# Patient Record
Sex: Female | Born: 1990 | Race: Black or African American | Hispanic: No | Marital: Single | Smoking: Never smoker
Health system: Southern US, Community
[De-identification: ages and names within clinical notes are randomized; demographics above are authoritative.]

## PROBLEM LIST (undated history)

## (undated) DIAGNOSIS — Z8619 Personal history of other infectious and parasitic diseases: Secondary | ICD-10-CM

## (undated) DIAGNOSIS — I10 Essential (primary) hypertension: Secondary | ICD-10-CM

## (undated) DIAGNOSIS — L732 Hidradenitis suppurativa: Secondary | ICD-10-CM

## (undated) DIAGNOSIS — E669 Obesity, unspecified: Secondary | ICD-10-CM

## (undated) HISTORY — PX: SLEEVE GASTROPLASTY: SHX1101

## (undated) HISTORY — DX: Obesity, unspecified: E66.9

## (undated) HISTORY — DX: Hidradenitis suppurativa: L73.2

---
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
## 2011-02-22 ENCOUNTER — Encounter: Payer: Self-pay | Admitting: Family Medicine

## 2011-02-22 ENCOUNTER — Ambulatory Visit (INDEPENDENT_AMBULATORY_CARE_PROVIDER_SITE_OTHER): Payer: BC Managed Care – PPO | Admitting: Family Medicine

## 2011-02-22 VITALS — BP 120/82 | HR 55 | Temp 98.3°F | Ht 70.0 in | Wt 269.4 lb

## 2011-02-22 DIAGNOSIS — Z0000 Encounter for general adult medical examination without abnormal findings: Secondary | ICD-10-CM | POA: Insufficient documentation

## 2011-02-22 MED ORDER — TUBERCULIN PPD 5 UNIT/0.1ML ID SOLN: 5.0000 [IU] | Freq: Once | INTRADERMAL | Status: DC

## 2011-02-22 NOTE — Progress Notes (Signed)
20 yo here to establish care to fill out college physical form.  Form reviewed, only with [facility] immunization record, she is UTD with exception of PPD.  Pt denies any complaints.  Not sexually active.  Periods are regular and not heavy.  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    The PMH, PSH, Social History, Family History, Medications, and allergies have been reviewed in [facility], and have been updated if relevant.  ROS: Patient reports no  vision/ hearing changes,anorexia, weight change, fever ,adenopathy, persistant / recurrent hoarseness, swallowing issues, chest pain, edema,persistant / recurrent cough, hemoptysis, dyspnea(rest, exertional, paroxysmal nocturnal), gastrointestinal  bleeding (melena, rectal bleeding), abdominal pain, excessive heart burn, GU symptoms(dysuria, hematuria, pyuria, voiding/incontinence  Issues) syncope, focal weakness, severe memory loss, concerning skin lesions, depression, anxiety, abnormal bruising/bleeding, major joint swelling, breast masses or abnormal vaginal bleeding.    Physical exam: BP 120/82  Pulse 55  Temp(Src) 98.3 F (36.8 C) (Oral)  Ht 5\' 10"  (1.778 m)  Wt 269 lb 6.4 oz (122.199 kg)  BMI 38.65 kg/m2  LMP 01/22/2011  General:  Obese,in no acute distress; alert,appropriate and cooperative throughout examination Head:  normocephalic and atraumatic.   Eyes:  vision grossly intact, pupils equal, pupils round, and pupils reactive to light.   Ears:  R ear normal and L ear normal.   Nose:  no external deformity.   Mouth:  good dentition.   Neck:  No deformities, masses, or tenderness noted. Lungs:  Normal respiratory effort, chest expands symmetrically. Lungs are clear to auscultation, no crackles or wheezes. Heart:  Normal rate and regular rhythm. S1 and S2 normal without gallop, murmur, click, rub or other extra sounds. Abdomen:  Bowel sounds positive,abdomen soft and non-tender without masses, organomegaly or hernias  noted. Msk:  No deformity or scoliosis noted of thoracic or lumbar spine.   Extremities:  No clubbing, cyanosis, edema, or deformity noted with normal full range of motion of all joints.   Neurologic:  alert & oriented X3 and gait normal.   Skin:  Intact without suspicious lesions or rashes Cervical Nodes:  No lymphadenopathy noted Axillary Nodes:  No palpable lymphadenopathy Psych:  Cognition and judgment appear intact. Alert and cooperative with normal attention span and concentration. No apparent delusions, illusions, hallucinations

## 2011-02-22 NOTE — Assessment & Plan Note (Signed)
PPD placed. Form filled out. Reviewed preventive care protocols, scheduled due services, and updated immunizations Discussed nutrition, exercise, diet, and healthy lifestyle.

## 2011-02-22 NOTE — Progress Notes (Signed)
Addended by: Gilmer Mor on: 02/22/2011 11:20 AM   Modules accepted: Orders

## 2011-02-24 ENCOUNTER — Ambulatory Visit (INDEPENDENT_AMBULATORY_CARE_PROVIDER_SITE_OTHER): Payer: BC Managed Care – PPO | Admitting: Family Medicine

## 2011-02-24 DIAGNOSIS — Z111 Encounter for screening for respiratory tuberculosis: Secondary | ICD-10-CM

## 2011-02-25 NOTE — Progress Notes (Signed)
  Subjective:    Patient ID: Victoria Gibbs, female    DOB: [birth year], 20 y.o.   MRN: [id number]  HPI  Here for PPD.  Review of Systems     Objective:   Physical Exam        Assessment & Plan:

## 2011-05-18 ENCOUNTER — Ambulatory Visit (INDEPENDENT_AMBULATORY_CARE_PROVIDER_SITE_OTHER): Payer: BC Managed Care – PPO | Admitting: Family Medicine

## 2011-05-18 ENCOUNTER — Encounter: Payer: Self-pay | Admitting: Family Medicine

## 2011-05-18 VITALS — BP 120/64 | HR 88 | Temp 98.5°F | Wt 268.2 lb

## 2011-05-18 DIAGNOSIS — N760 Acute vaginitis: Secondary | ICD-10-CM

## 2011-05-18 MED ORDER — FLUCONAZOLE 150 MG PO TABS: 150.0000 mg | ORAL_TABLET | Freq: Once | ORAL | Status: AC

## 2011-05-18 NOTE — Progress Notes (Signed)
Addended by: Baldomero Lamy on: 05/18/2011 12:38 PM   Modules accepted: Orders

## 2011-05-18 NOTE — Progress Notes (Signed)
SUBJECTIVE:  20 y.o. female complains of scant vaginal discharge for 2 day(s). Denies abnormal vaginal bleeding or significant pelvic pain or fever. No UTI symptoms. Denies history of known exposure to STD but does want to be checked for GC/Chlamydia.  Patient Active Problem List  Diagnoses  . Routine general medical examination at a health care facility   Past Medical History  Diagnosis Date  . Obesity    No past surgical history on file. History  Substance Use Topics  . Smoking status: Never Smoker   . Smokeless tobacco: Not on file  . Alcohol Use: Not on file   Family History  Problem Relation Age of Onset  . Hypertension Mother    No Known Allergies No current outpatient prescriptions on file prior to visit.    Patient's last menstrual period was 03/29/2011.  OBJECTIVE:  BP 120/64  Pulse 88  Temp(Src) 98.5 F (36.9 C) (Oral)  Wt 268 lb 4 oz (121.677 kg)  LMP 03/29/2011  She appears well, afebrile. Abdomen: benign, soft, nontender, no masses. Pelvic Exam: normal external genitalia, vulva, vagina, cervix, uterus and adnexa.   ASSESSMENT:  C. albicans vulvovaginitis  PLAN:  GC and chlamydia DNA  probe sent to lab. Treatment: OTC yeast cream such as Monistat or Gyne-Lotrimin and diflucan 150 mg po x 1. ROV prn if symptoms persist or worsen.

## 2011-12-25 ENCOUNTER — Emergency Department: Payer: Self-pay | Admitting: Internal Medicine

## 2012-02-02 ENCOUNTER — Encounter: Payer: Self-pay | Admitting: Family Medicine

## 2012-02-02 ENCOUNTER — Ambulatory Visit (INDEPENDENT_AMBULATORY_CARE_PROVIDER_SITE_OTHER): Payer: BC Managed Care – PPO | Admitting: Family Medicine

## 2012-02-02 VITALS — BP 110/70 | HR 64 | Temp 98.0°F | Ht 69.25 in | Wt 286.0 lb

## 2012-02-02 DIAGNOSIS — Z0000 Encounter for general adult medical examination without abnormal findings: Secondary | ICD-10-CM

## 2012-02-02 NOTE — Progress Notes (Signed)
21 yo here for CPX and to fill out college physical form.  Form reviewed, only with [facility] immunization record, she is UTD with exception of PPD.  Pt denies any complaints.  Not sexually active.  Periods are regular and not heavy.  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    Patient Active Problem List  Diagnoses  . Routine general medical examination at a health care facility   Past Medical History  Diagnosis Date  . Obesity    No past surgical history on file. History  Substance Use Topics  . Smoking status: Never Smoker   . Smokeless tobacco: Not on file  . Alcohol Use: Not on file   Family History  Problem Relation Age of Onset  . Hypertension Mother    No Known Allergies Current Outpatient Prescriptions on File Prior to Visit  Medication Sig Dispense Refill  . Etonogestrel (IMPLANON) 68 MG IMPL Inject into the skin.           The PMH, PSH, Social History, Family History, Medications, and allergies have been reviewed in [facility], and have been updated if relevant.  ROS: Patient reports no  vision/ hearing changes,anorexia, weight change, fever ,adenopathy, persistant / recurrent hoarseness, swallowing issues, chest pain, edema,persistant / recurrent cough, hemoptysis, dyspnea(rest, exertional, paroxysmal nocturnal), gastrointestinal  bleeding (melena, rectal bleeding), abdominal pain, excessive heart burn, GU symptoms(dysuria, hematuria, pyuria, voiding/incontinence  Issues) syncope, focal weakness, severe memory loss, concerning skin lesions, depression, anxiety, abnormal bruising/bleeding, major joint swelling, breast masses or abnormal vaginal bleeding.    Physical exam: BP 110/70  Pulse 64  Temp(Src) 98 F (36.7 C) (Oral)  Ht 5' 9.25" (1.759 m)  Wt 286 lb (129.729 kg)  BMI 41.93 kg/m2  General:  Obese,in no acute distress; alert,appropriate and cooperative throughout examination Head:  normocephalic and atraumatic.   Eyes:  vision grossly intact,  pupils equal, pupils round, and pupils reactive to light.   Ears:  R ear normal and L ear normal.   Nose:  no external deformity.   Mouth:  good dentition.   Neck:  No deformities, masses, or tenderness noted. Lungs:  Normal respiratory effort, chest expands symmetrically. Lungs are clear to auscultation, no crackles or wheezes. Heart:  Normal rate and regular rhythm. S1 and S2 normal without gallop, murmur, click, rub or other extra sounds. Abdomen:  Bowel sounds positive,abdomen soft and non-tender without masses, organomegaly or hernias noted. Msk:  No deformity or scoliosis noted of thoracic or lumbar spine.   Extremities:  No clubbing, cyanosis, edema, or deformity noted with normal full range of motion of all joints.   Neurologic:  alert & oriented X3 and gait normal.   Skin:  Intact without suspicious lesions or rashes Psych:  Cognition and judgment appear intact. Alert and cooperative with normal attention span and concentration. No apparent delusions, illusions, hallucinations  Assessment and Plan: 1. Routine general medical examination at a health care facility    Reviewed preventive care protocols, scheduled due services, and updated immunizations Discussed nutrition, exercise, diet, and healthy lifestyle. Form filled out and returned to patient.

## 2013-02-27 ENCOUNTER — Ambulatory Visit (INDEPENDENT_AMBULATORY_CARE_PROVIDER_SITE_OTHER): Payer: BC Managed Care – PPO | Admitting: Family Medicine

## 2013-02-27 ENCOUNTER — Encounter: Payer: Self-pay | Admitting: Family Medicine

## 2013-02-27 VITALS — BP 118/80 | HR 64 | Temp 98.2°F | Wt 300.0 lb

## 2013-02-27 DIAGNOSIS — R51 Headache: Secondary | ICD-10-CM | POA: Insufficient documentation

## 2013-02-27 DIAGNOSIS — M25473 Effusion, unspecified ankle: Secondary | ICD-10-CM | POA: Insufficient documentation

## 2013-02-27 DIAGNOSIS — R519 Headache, unspecified: Secondary | ICD-10-CM | POA: Insufficient documentation

## 2013-02-27 LAB — COMPREHENSIVE METABOLIC PANEL
ALT: 14 U/L (ref 0–35)
AST: 16 U/L (ref 0–37)
Albumin: 3.5 g/dL (ref 3.5–5.2)
Calcium: 9.3 mg/dL (ref 8.4–10.5)
Chloride: 107 mEq/L (ref 96–112)
Potassium: 3.8 mEq/L (ref 3.5–5.1)
Total Protein: 8.3 g/dL (ref 6.0–8.3)

## 2013-02-27 LAB — T4, FREE: Free T4: 0.88 ng/dL (ref 0.60–1.60)

## 2013-02-27 LAB — TSH: TSH: 2.17 u[IU]/mL (ref 0.35–5.50)

## 2013-02-27 MED ORDER — HYDROCHLOROTHIAZIDE 25 MG PO TABS: 25.0000 mg | ORAL_TABLET | Freq: Every day | ORAL | Status: DC

## 2013-02-27 NOTE — Progress Notes (Signed)
22 yo female here for urgent care follow up.  Notes reviewed.  Went to [facility] in [place] on 01/25/2013 for bilateral ankle swelling x 1 month.  CMET, CBC wnl. UA and U preg neg. She denies any CP or SOB.  EKG showed sinus bradycardia, otherwise normal.  Started on HCTZ 50 mg daily!  Was not started on Potassium.  Swelling did improve but has felt dizziness when standing from seated position. She is having leg cramping.  Has gained 30 pounds since last year.  In school at [employer]. Working at [employer]- on her feet all day.  Patient Active Problem List   Diagnosis Date Noted  . Morbid obesity 02/27/2013  . Headache(784.0) 02/27/2013  . Ankle swelling 02/27/2013   Past Medical History  Diagnosis Date  . Obesity    No past surgical history on file. History  Substance Use Topics  . Smoking status: Never Smoker   . Smokeless tobacco: Not on file  . Alcohol Use: Not on file   Family History  Problem Relation Age of Onset  . Hypertension Mother    No Known Allergies Current Outpatient Prescriptions on File Prior to Visit  Medication Sig Dispense Refill  . Etonogestrel (IMPLANON) 68 MG IMPL Inject into the skin.         No current facility-administered medications on file prior to visit.     The PMH, PSH, Social History, Family History, Medications, and allergies have been reviewed in [facility], and have been updated if relevant.  ROS: See HPI No CP, No SOB +muscle spasms in legs   + cold/heat intolerance   Physical exam: BP 118/80  Pulse 64  Temp(Src) 98.2 F (36.8 C)  Wt 300 lb (136.079 kg)  BMI 43.98 kg/m2 Wt Readings from Last 3 Encounters:  02/27/13 300 lb (136.079 kg)  02/02/12 286 lb (129.729 kg) (100%*, Z = 2.74)  05/18/11 268 lb 4 oz (121.677 kg) (100%*, Z = 2.59)   * Growth percentiles are based on CDC 2-20 Years data.     General:  Obese,in no acute distress; alert,appropriate and cooperative throughout examination Head:   normocephalic and atraumatic.   Eyes:  vision grossly intact, pupils equal, pupils round, and pupils reactive to light.   Ears:  R ear normal and L ear normal.   Nose:  no external deformity.   Mouth:  good dentition.   Neck:  No deformities, masses, or tenderness noted. Lungs:  Normal respiratory effort, chest expands symmetrically. Lungs are clear to auscultation, no crackles or wheezes. Heart:  Normal rate and regular rhythm. S1 and S2 normal without gallop, murmur, click, rub or other extra sounds. Abdomen:  Bowel sounds positive,abdomen soft and non-tender without masses, organomegaly or hernias noted. Msk:  No deformity or scoliosis noted of thoracic or lumbar spine.   Extremities:  No clubbing, cyanosis, edema, or deformity noted with normal full range of motion of all joints.   Neurologic:  alert & oriented X3 and gait normal.   Skin:  Intact without suspicious lesions or rashes Psych:  Cognition and judgment appear intact. Alert and cooperative with normal attention span and concentration. No apparent delusions, illusions, hallucinations  Assessment and Plan: 1. Morbid obesity Deteriorated. - TSH - T4, Free  2. Ankle swelling, unspecified laterality Improved with HCTZ but very high dose (also having symptoms of orthostasis)- will check electrolytes as her potassium is likely low.  Decrease HCTZ to 25 mg daily. Likely due to increased weight and standing on  feet all day.  - Comprehensive metabolic panel

## 2013-02-27 NOTE — Patient Instructions (Addendum)
We are decreasing your hydrochlorothiazide to 25 mg daily. We will call you with your lab results.  Make sure you keep your feet elevated.

## 2013-05-07 ENCOUNTER — Encounter: Payer: BC Managed Care – PPO | Admitting: Family Medicine

## 2013-07-25 ENCOUNTER — Other Ambulatory Visit: Payer: Self-pay

## 2013-07-29 ENCOUNTER — Telehealth: Payer: Self-pay

## 2013-07-29 NOTE — Telephone Encounter (Signed)
For last 6 months pt has a white vaginal discharge and itching and perineal itching every month; no pattern to when will occur and not related to menstrual cycle.pt has been using monistat 3 day and 5 day OTC; symptoms go away after use but come back the next month. Today pt is having vaginal discharge with vag and perineal itching and burns when urinates. Pt is student at [employer] and will not be back to area until 08/09/13 when pt scheduled appt with Nicki Reaper NP on 08/09/13 at 4 pm. Pt has no fever or abd pain. Pt wants to know what to do until appt. Pt has never seen physician in [place]. Pt request cb. [employer].

## 2013-07-29 NOTE — Telephone Encounter (Signed)
I would advise her to visit student health if she can not wait until she comes home to see me.

## 2013-07-30 NOTE — Telephone Encounter (Signed)
Left v/m for pt to cb. 

## 2013-07-30 NOTE — Telephone Encounter (Signed)
Patient notified as instructed by telephone. Pt voiced understanding.  

## 2013-08-09 ENCOUNTER — Ambulatory Visit (INDEPENDENT_AMBULATORY_CARE_PROVIDER_SITE_OTHER): Payer: BC Managed Care – PPO | Admitting: Internal Medicine

## 2013-08-09 ENCOUNTER — Encounter: Payer: Self-pay | Admitting: Internal Medicine

## 2013-08-09 VITALS — BP 120/80 | HR 95 | Temp 98.3°F | Wt 312.0 lb

## 2013-08-09 DIAGNOSIS — B9689 Other specified bacterial agents as the cause of diseases classified elsewhere: Secondary | ICD-10-CM

## 2013-08-09 DIAGNOSIS — A499 Bacterial infection, unspecified: Secondary | ICD-10-CM

## 2013-08-09 DIAGNOSIS — B373 Candidiasis of vulva and vagina: Secondary | ICD-10-CM

## 2013-08-09 DIAGNOSIS — B3731 Acute candidiasis of vulva and vagina: Secondary | ICD-10-CM

## 2013-08-09 DIAGNOSIS — N760 Acute vaginitis: Secondary | ICD-10-CM

## 2013-08-09 LAB — POCT URINE PREGNANCY: Preg Test, Ur: NEGATIVE

## 2013-08-09 MED ORDER — METRONIDAZOLE 0.75 % VA GEL: 1.0000 | Freq: Two times a day (BID) | VAGINAL | Status: DC

## 2013-08-09 MED ORDER — FLUCONAZOLE 150 MG PO TABS: 150.0000 mg | ORAL_TABLET | Freq: Once | ORAL | Status: DC

## 2013-08-09 NOTE — Patient Instructions (Signed)
Bacterial Vaginosis Bacterial vaginosis (BV) is a vaginal infection where the normal balance of bacteria in the vagina is disrupted. The normal balance is then replaced by an overgrowth of certain bacteria. There are several different kinds of bacteria that can cause BV. BV is the most common vaginal infection in women of childbearing age. CAUSES   The cause of BV is not fully understood. BV develops when there is an increase or imbalance of harmful bacteria.  Some activities or behaviors can upset the normal balance of bacteria in the vagina and put women at increased risk including:  Having a new sex partner or multiple sex partners.  Douching.  Using an intrauterine device (IUD) for contraception.  It is not clear what role sexual activity plays in the development of BV. However, women that have never had sexual intercourse are rarely infected with BV. Women do not get BV from toilet seats, bedding, swimming pools or from touching objects around them.  SYMPTOMS   Grey vaginal discharge.  A fish-like odor with discharge, especially after sexual intercourse.  Itching or burning of the vagina and vulva.  Burning or pain with urination.  Some women have no signs or symptoms at all. DIAGNOSIS  Your caregiver must examine the vagina for signs of BV. Your caregiver will perform lab tests and look at the sample of vaginal fluid through a microscope. They will look for bacteria and abnormal cells (clue cells), a pH test higher than 4.5, and a positive amine test all associated with BV.  RISKS AND COMPLICATIONS   Pelvic inflammatory disease (PID).  Infections following gynecology surgery.  Developing HIV.  Developing herpes virus. TREATMENT  Sometimes BV will clear up without treatment. However, all women with symptoms of BV should be treated to avoid complications, especially if gynecology surgery is planned. Female partners generally do not need to be treated. However, BV may spread  between female sex partners so treatment is helpful in preventing a recurrence of BV.   BV may be treated with antibiotics. The antibiotics come in either pill or vaginal cream forms. Either can be used with nonpregnant or pregnant women, but the recommended dosages differ. These antibiotics are not harmful to the baby.  BV can recur after treatment. If this happens, a second round of antibiotics will often be prescribed.  Treatment is important for pregnant women. If not treated, BV can cause a premature delivery, especially for a pregnant woman who had a premature birth in the past. All pregnant women who have symptoms of BV should be checked and treated.  For chronic reoccurrence of BV, treatment with a type of prescribed gel vaginally twice a week is helpful. HOME CARE INSTRUCTIONS   Finish all medication as directed by your caregiver.  Do not have sex until treatment is completed.  Tell your sexual partner that you have a vaginal infection. They should see their caregiver and be treated if they have problems, such as a mild rash or itching.  Practice safe sex. Use condoms. Only have 1 sex partner. PREVENTION  Basic prevention steps can help reduce the risk of upsetting the natural balance of bacteria in the vagina and developing BV:  Do not have sexual intercourse (be abstinent).  Do not douche.  Use all of the medicine prescribed for treatment of BV, even if the signs and symptoms go away.  Tell your sex partner if you have BV. That way, they can be treated, if needed, to prevent reoccurrence. SEEK MEDICAL CARE IF:     Your symptoms are not improving after 3 days of treatment.  You have increased discharge, pain, or fever. MAKE SURE YOU:   Understand these instructions.  Will watch your condition.  Will get help right away if you are not doing well or get worse. FOR MORE INFORMATION  Division of STD Prevention (DSTDP), Centers for Disease Control and Prevention:  www.cdc.gov/std American Social Health Association (ASHA): www.ashastd.org  Document Released: 09/05/2005 Document Revised: 11/28/2011 Document Reviewed: 04/17/2013 ExitCare Patient Information 2014 [employer].  

## 2013-08-09 NOTE — Progress Notes (Signed)
  Subjective:    Patient ID: Victoria Gibbs, female    DOB: 1991/05/25, 22 y.o.   MRN: [id number]  HPI  Pt presents to the clinic today with c/o vaginal discharge. This started about a week ago. She does have reoccuring yeast infections. She is using Monistat OTC which helps it go away but it comes right back. In between yeast infection, she still has a thin white discharge. Her LMP was beginning of October. She does not think she is pregnant. She is on the implanon. She is not concerned about STD.  Review of Systems      Past Medical History  Diagnosis Date  . Obesity     Current Outpatient Prescriptions  Medication Sig Dispense Refill  . Etonogestrel (IMPLANON) 68 MG IMPL Inject into the skin.        . hydrochlorothiazide (HYDRODIURIL) 25 MG tablet Take 1 tablet (25 mg total) by mouth daily.  90 tablet  3   No current facility-administered medications for this visit.    No Known Allergies  Family History  Problem Relation Age of Onset  . Hypertension Mother     History   Social History  . Marital Status: Single    Spouse Name: N/A    Number of Children: N/A  . Years of Education: N/A   Occupational History  . Not on file.   Social History Main Topics  . Smoking status: Never Smoker   . Smokeless tobacco: Not on file  . Alcohol Use: Not on file  . Drug Use: Not on file  . Sexual Activity: Not on file   Other Topics Concern  . Not on file   Social History Narrative  . No narrative on file     Constitutional: Denies fever, malaise, fatigue, headache or abrupt weight changes.  GU: Denies urgency, frequency, pain with urination, burning sensation, blood in urine, odor.   No other specific complaints in a complete review of systems (except as listed in HPI above).  Objective:   Physical Exam  BP 120/80  Pulse 95  Temp(Src) 98.3 F (36.8 C) (Tympanic)  Wt 312 lb (141.522 kg)  SpO2 99% Wt Readings from Last 3 Encounters:  08/09/13 312 lb (141.522 kg)   02/27/13 300 lb (136.079 kg)  02/02/12 286 lb (129.729 kg) (100%*, Z = 2.74)   * Growth percentiles are based on CDC 2-20 Years data.    Constitutional:  Alert, oriented x 4, well developed, well nourished in no apparent distress. Cardiovascular: Normal rate and rhythm. S1,S2 noted.  No murmur, rubs or gallops noted. No JVD or BLE edema. No carotid bruits noted. Pulmonary/Chest: Normal effort and positive vesicular breath sounds. No respiratory distress. No wheezes, rales or ronchi noted.  Abdomen: Soft and nontender. Normal bowel sounds, no bruits noted. No distention or masses noted. Liver, spleen and kidneys non palpable. Genitourinary: Normal female anatomy. Uterus midline, anterior and soft. No CMT. Thick with discharge noted. Adenexa non palpable.        Assessment & Plan:   Vaginal discharge secondary to yeast infection and BV:  Wet prep obtained: + clue cells, + yeast, no trich eRx for diflucan and metrogel Urine preg negative   RTC as needed or if symptoms persist or worsen

## 2013-10-03 ENCOUNTER — Telehealth: Payer: Self-pay

## 2013-10-03 NOTE — Telephone Encounter (Signed)
Pt has perineal itching, vaginal discharge is almost gone. Pt scheduled appt Nicki Reaperegina Baity NP 10/04/13.

## 2013-10-04 ENCOUNTER — Ambulatory Visit (INDEPENDENT_AMBULATORY_CARE_PROVIDER_SITE_OTHER): Payer: BC Managed Care – PPO | Admitting: Internal Medicine

## 2013-10-04 ENCOUNTER — Encounter: Payer: Self-pay | Admitting: Internal Medicine

## 2013-10-04 VITALS — BP 114/68 | HR 87 | Temp 99.2°F | Wt 316.5 lb

## 2013-10-04 DIAGNOSIS — B373 Candidiasis of vulva and vagina: Secondary | ICD-10-CM

## 2013-10-04 DIAGNOSIS — B9689 Other specified bacterial agents as the cause of diseases classified elsewhere: Secondary | ICD-10-CM

## 2013-10-04 DIAGNOSIS — I10 Essential (primary) hypertension: Secondary | ICD-10-CM

## 2013-10-04 DIAGNOSIS — N760 Acute vaginitis: Secondary | ICD-10-CM

## 2013-10-04 DIAGNOSIS — A499 Bacterial infection, unspecified: Secondary | ICD-10-CM

## 2013-10-04 DIAGNOSIS — B3731 Acute candidiasis of vulva and vagina: Secondary | ICD-10-CM

## 2013-10-04 MED ORDER — HYDROCHLOROTHIAZIDE 25 MG PO TABS: 25.0000 mg | ORAL_TABLET | Freq: Every day | ORAL | Status: DC

## 2013-10-04 MED ORDER — FLUCONAZOLE 150 MG PO TABS: 150.0000 mg | ORAL_TABLET | Freq: Once | ORAL | Status: DC

## 2013-10-04 MED ORDER — METRONIDAZOLE 0.75 % VA GEL: 1.0000 | Freq: Two times a day (BID) | VAGINAL | Status: DC

## 2013-10-04 NOTE — Patient Instructions (Signed)
Candidal Vulvovaginitis  Candidal vulvovaginitis is an infection of the vagina and vulva. The vulva is the skin around the opening of the vagina. This may cause itching and discomfort in and around the vagina.   HOME CARE  · Only take medicine as told by your doctor.  · Do not have sex (intercourse) until the infection is healed or as told by your doctor.  · Practice safe sex.  · Tell your sex partner about your infection.  · Do not douche or use tampons.  · Wear cotton underwear. Do not wear tight pants or panty hose.  · Eat yogurt. This may help treat and prevent yeast infections.  GET HELP RIGHT AWAY IF:   · You have a fever.  · Your problems get worse during treatment or do not get better in 3 days.  · You have discomfort, irritation, or itching in your vagina or vulva area.  · You have pain after sex.  · You start to get belly (abdominal) pain.  MAKE SURE YOU:  · Understand these instructions.  · Will watch your condition.  · Will get help right away if you are not doing well or get worse.  Document Released: 12/02/2008 Document Revised: 11/28/2011 Document Reviewed: 12/02/2008  ExitCare® Patient Information ©2014 [employer].

## 2013-10-04 NOTE — Progress Notes (Signed)
Pre-visit discussion using our clinic review tool. No additional management support is needed unless otherwise documented below in the visit note.  

## 2013-10-04 NOTE — Progress Notes (Signed)
Subjective:    Patient ID: Victoria Gibbs, female    DOB: 1990-10-27, 23 y.o.   MRN: [id number]  HPI  Pt presents to the clinic today with c/o perianal itching. She also c/o vaginal discharge. She describes it as thin and white. It does not have an odor to it. She is sexually active. She does have an implanon. She is not concerned about STD's. She has not changed soaps, detergents. She does not douche or take bubble baths.  Additionally, she would like a refill of her HCTZ. Her blood pressure is well controlled and she is tolerating the medication well without side effects.  Review of Systems  Past Medical History  Diagnosis Date  . Obesity     Current Outpatient Prescriptions  Medication Sig Dispense Refill  . Etonogestrel (IMPLANON) 68 MG IMPL Inject into the skin.        . fluconazole (DIFLUCAN) 150 MG tablet Take 1 tablet (150 mg total) by mouth once.  1 tablet  0  . hydrochlorothiazide (HYDRODIURIL) 25 MG tablet Take 1 tablet (25 mg total) by mouth daily.  90 tablet  3  . metroNIDAZOLE (METROGEL) 0.75 % vaginal gel Place 1 Applicatorful vaginally 2 (two) times daily.  70 g  0   No current facility-administered medications for this visit.    No Known Allergies  Family History  Problem Relation Age of Onset  . Hypertension Mother     History   Social History  . Marital Status: Single    Spouse Name: N/A    Number of Children: N/A  . Years of Education: N/A   Occupational History  . Not on file.   Social History Main Topics  . Smoking status: Never Smoker   . Smokeless tobacco: Not on file  . Alcohol Use: Yes     Comment: occasional  . Drug Use: Not on file  . Sexual Activity: Not on file   Other Topics Concern  . Not on file   Social History Narrative  . No narrative on file     Constitutional: Denies fever, malaise, fatigue, headache or abrupt weight changes.  Respiratory: Denies difficulty breathing, shortness of breath, cough or sputum  production.   Cardiovascular: Denies chest pain, chest tightness, palpitations or swelling in the hands or feet.  GU: Denies urgency, frequency, pain with urination, burning sensation, blood in urine, odor. Neurological: Denies dizziness, difficulty with memory, difficulty with speech or problems with balance and coordination.   No other specific complaints in a complete review of systems (except as listed in HPI above).     Objective:   Physical Exam    BP 114/68  Pulse 87  Temp(Src) 99.2 F (37.3 C) (Oral)  Wt 316 lb 8 oz (143.563 kg)  SpO2 98%  LMP 08/25/2013 Wt Readings from Last 3 Encounters:  10/04/13 316 lb 8 oz (143.563 kg)  08/09/13 312 lb (141.522 kg)  02/27/13 300 lb (136.079 kg)    General: Appears her stated age, obese but well developed, well nourished in NAD. Cardiovascular: Normal rate and rhythm. S1,S2 noted.  No murmur, rubs or gallops noted. No JVD or BLE edema. No carotid bruits noted. Pulmonary/Chest: Normal effort and positive vesicular breath sounds. No respiratory distress. No wheezes, rales or ronchi noted.  Abdomen: Soft and nontender. Normal bowel sounds, no bruits noted. No distention or masses noted. Liver, spleen and kidneys non palpable. Pelvic: No evidence of yeast on the outside skin, small amount of thin white discharge with foul  odor.  BMET    Component Value Date/Time   NA 136 02/27/2013 1053   K 3.8 02/27/2013 1053   CL 107 02/27/2013 1053   CO2 21 02/27/2013 1053   GLUCOSE 85 02/27/2013 1053   BUN 10 02/27/2013 1053   CREATININE 0.7 02/27/2013 1053   CALCIUM 9.3 02/27/2013 1053         Assessment & Plan:   Vaginal Discharge and Itching secondary to recurrent yeast and BV:  Wet prep obtained- positive whiff, clue cells and yeast buds Will refill Diflucan and Metrogel You will have a pap with your next physical Avoid douching, harsh soaps and bubble baths.  HTN:  Likely related to obesity HCTZ refilled today  RTC as needed

## 2013-10-05 ENCOUNTER — Telehealth: Payer: Self-pay | Admitting: Family Medicine

## 2013-10-05 NOTE — Telephone Encounter (Signed)
Relevant patient education assigned to patient using Emmi. ° °

## 2013-10-07 ENCOUNTER — Ambulatory Visit: Payer: BC Managed Care – PPO | Admitting: Internal Medicine

## 2014-04-21 ENCOUNTER — Other Ambulatory Visit (HOSPITAL_COMMUNITY)
Admission: RE | Admit: 2014-04-21 | Discharge: 2014-04-21 | Disposition: A | Discharge status: Home / Self Care | Payer: BC Managed Care – PPO | Source: Ambulatory Visit | Attending: Family Medicine | Admitting: Family Medicine

## 2014-04-21 ENCOUNTER — Ambulatory Visit (INDEPENDENT_AMBULATORY_CARE_PROVIDER_SITE_OTHER): Payer: BC Managed Care – PPO | Admitting: Family Medicine

## 2014-04-21 ENCOUNTER — Encounter: Payer: Self-pay | Admitting: Family Medicine

## 2014-04-21 VITALS — BP 126/78 | HR 75 | Temp 98.4°F | Ht 69.25 in | Wt 329.0 lb

## 2014-04-21 DIAGNOSIS — Z113 Encounter for screening for infections with a predominantly sexual mode of transmission: Secondary | ICD-10-CM

## 2014-04-21 DIAGNOSIS — Z01419 Encounter for gynecological examination (general) (routine) without abnormal findings: Secondary | ICD-10-CM

## 2014-04-21 DIAGNOSIS — N926 Irregular menstruation, unspecified: Secondary | ICD-10-CM

## 2014-04-21 DIAGNOSIS — Z1151 Encounter for screening for human papillomavirus (HPV): Secondary | ICD-10-CM | POA: Insufficient documentation

## 2014-04-21 LAB — HCG, QUANTITATIVE, PREGNANCY: Quantitative HCG: 0.04 m[IU]/mL

## 2014-04-21 NOTE — Patient Instructions (Signed)
Good to see you. We will call you with your lab and pap smear results.  Please call [facility] with the name of your birth control pill.

## 2014-04-21 NOTE — Progress Notes (Signed)
Pre visit review using our clinic review tool, if applicable. No additional management support is needed unless otherwise documented below in the visit note. 

## 2014-04-21 NOTE — Progress Notes (Signed)
Subjective:   Patient ID: Victoria Gibbs, female    DOB: 1991-06-25, 23 y.o.   MRN: [id number]  Victoria Gibbs is a pleasant 23 y.o. year old female who presents to clinic today with Menstrual Problem  on 04/21/2014  HPI: G0 here for irregular periods.  She would like pap smear- has never had one.  Had implanon for 3 years- had it removed in March at [facility].  Started on progesterone only OCP but she cannot remember name.  Since then, periods have been irregular and light- skipped a month - ?April. Was regular last month.  Upreg neg at home.  Had STD screening for gonorrhea, chlamydia and trichomonas per pt.  Would like HIV and RPR testing.  Sexually active with one partner.  Current Outpatient Prescriptions on File Prior to Visit  Medication Sig Dispense Refill  . hydrochlorothiazide (HYDRODIURIL) 25 MG tablet Take 1 tablet (25 mg total) by mouth daily.  90 tablet  3  . metroNIDAZOLE (METROGEL) 0.75 % vaginal gel Place 1 Applicatorful vaginally 2 (two) times daily.  70 g  0   No current facility-administered medications on file prior to visit.    No Known Allergies  Past Medical History  Diagnosis Date  . Obesity     No past surgical history on file.  Family History  Problem Relation Age of Onset  . Hypertension Mother     History   Social History  . Marital Status: Single    Spouse Name: N/A    Number of Children: N/A  . Years of Education: N/A   Occupational History  . Not on file.   Social History Main Topics  . Smoking status: Never Smoker   . Smokeless tobacco: Not on file  . Alcohol Use: Yes     Comment: occasional  . Drug Use: Not on file  . Sexual Activity: Not on file   Other Topics Concern  . Not on file   Social History Narrative  . No narrative on file   The PMH, PSH, Social History, Family History, Medications, and allergies have been reviewed in [facility], and have been updated if relevant.   Review of Systems Denies  dysuria, abnormal vaginal discharge or pelvic pain    Objective:    BP 126/78  Pulse 75  Temp(Src) 98.4 F (36.9 C) (Oral)  Ht 5' 9.25" (1.759 m)  Wt 329 lb (149.233 kg)  BMI 48.23 kg/m2  SpO2 98%  LMP 02/24/2014   Physical Exam   General:  Obese, Well-developed,well-nourished,in no acute distress; alert,appropriate and cooperative throughout examination Head:  normocephalic and atraumatic.   Abdomen:  Bowel sounds positive,abdomen soft and non-tender without masses, organomegaly or hernias noted. Rectal:  no external abnormalities.   Genitalia:  Pelvic Exam:        External: normal female genitalia without lesions or masses        Vagina: normal without lesions or masses        Cervix: normal without lesions or masses        Adnexa: normal bimanual exam without masses or fullness        Uterus: normal by palpation        Pap smear: performed Msk:  No deformity or scoliosis noted of thoracic or lumbar spine.   Extremities:  No clubbing, cyanosis, edema, or deformity noted with normal full range of motion of all joints.   Neurologic:  alert & oriented X3 and gait normal.   Skin:  Intact  without suspicious lesions or rashes Cervical Nodes:  No lymphadenopathy noted Axillary Nodes:  No palpable lymphadenopathy Psych:  Cognition and judgment appear intact. Alert and cooperative with normal attention span and concentration. No apparent delusions, illusions, hallucinations       Assessment & Plan:   Irregular periods - Plan: hCG, quantitative, pregnancy  Encounter for routine gynecological examination  Screening for STDs (sexually transmitted diseases) - Plan: RPR, HIV antibody (with reflex) No Follow-up on file.

## 2014-04-21 NOTE — Assessment & Plan Note (Signed)
Orders Placed This Encounter  Procedures  . hCG, quantitative, pregnancy  . RPR  . HIV antibody (with reflex)

## 2014-04-21 NOTE — Assessment & Plan Note (Signed)
Likely due to body adjusting to her OCP. She will call with name of progesterone only OCP she is taking. Check hcg quant today to rule out pregnancy.  Advised to give her body a few more months to adjust since she is otherwise asymptomatic. The patient indicates understanding of these issues and agrees with the plan.

## 2014-04-21 NOTE — Assessment & Plan Note (Signed)
Pap smear done today

## 2014-04-22 LAB — HIV ANTIBODY (ROUTINE TESTING W REFLEX): HIV: NONREACTIVE

## 2014-04-22 LAB — RPR

## 2014-04-23 ENCOUNTER — Encounter: Payer: Self-pay | Admitting: *Deleted

## 2014-04-23 LAB — CYTOLOGY - PAP

## 2015-02-10 ENCOUNTER — Encounter: Payer: Self-pay | Admitting: Emergency Medicine

## 2015-02-10 ENCOUNTER — Emergency Department
Admission: EM | Admit: 2015-02-10 | Discharge: 2015-02-10 | Disposition: A | Discharge status: Home / Self Care | Payer: BLUE CROSS/BLUE SHIELD | Attending: Emergency Medicine | Admitting: Emergency Medicine

## 2015-02-10 DIAGNOSIS — R1030 Lower abdominal pain, unspecified: Secondary | ICD-10-CM | POA: Insufficient documentation

## 2015-02-10 DIAGNOSIS — M545 Low back pain: Secondary | ICD-10-CM | POA: Diagnosis not present

## 2015-02-10 DIAGNOSIS — Z3202 Encounter for pregnancy test, result negative: Secondary | ICD-10-CM | POA: Diagnosis not present

## 2015-02-10 DIAGNOSIS — Z79899 Other long term (current) drug therapy: Secondary | ICD-10-CM | POA: Insufficient documentation

## 2015-02-10 DIAGNOSIS — I10 Essential (primary) hypertension: Secondary | ICD-10-CM | POA: Insufficient documentation

## 2015-02-10 HISTORY — DX: Essential (primary) hypertension: I10

## 2015-02-10 LAB — URINALYSIS COMPLETE WITH MICROSCOPIC (ARMC ONLY)
Bacteria, UA: NONE SEEN
Bilirubin Urine: NEGATIVE
Glucose, UA: NEGATIVE mg/dL
HGB URINE DIPSTICK: NEGATIVE
Ketones, ur: NEGATIVE mg/dL
LEUKOCYTES UA: NEGATIVE
NITRITE: NEGATIVE
PH: 6 (ref 5.0–8.0)
PROTEIN: NEGATIVE mg/dL
SPECIFIC GRAVITY, URINE: 1.031 — AB (ref 1.005–1.030)

## 2015-02-10 LAB — COMPREHENSIVE METABOLIC PANEL
ALBUMIN: 3.8 g/dL (ref 3.5–5.0)
ALT: 17 U/L (ref 14–54)
ANION GAP: 6 (ref 5–15)
AST: 18 U/L (ref 15–41)
Alkaline Phosphatase: 55 U/L (ref 38–126)
BUN: 12 mg/dL (ref 6–20)
CO2: 27 mmol/L (ref 22–32)
Calcium: 9.2 mg/dL (ref 8.9–10.3)
Chloride: 105 mmol/L (ref 101–111)
Creatinine, Ser: 0.78 mg/dL (ref 0.44–1.00)
GFR calc non Af Amer: >60 mL/min (ref >60)
Glucose, Bld: 71 mg/dL (ref 65–99)
Potassium: 3.3 mmol/L — ABNORMAL LOW (ref 3.5–5.1)
Sodium: 138 mmol/L (ref 135–145)
TOTAL PROTEIN: 8.7 g/dL — AB (ref 6.5–8.1)
Total Bilirubin: 0.2 mg/dL — ABNORMAL LOW (ref 0.3–1.2)

## 2015-02-10 LAB — CBC WITH DIFFERENTIAL/PLATELET
BASOS PCT: 1 %
Basophils Absolute: 0.1 10*3/uL (ref 0–0.1)
Eosinophils Absolute: 0.1 10*3/uL (ref 0–0.7)
Eosinophils Relative: 1 %
HCT: 35.2 % (ref 35.0–47.0)
Hemoglobin: 11.3 g/dL — ABNORMAL LOW (ref 12.0–16.0)
LYMPHS PCT: 26 %
Lymphs Abs: 2 10*3/uL (ref 1.0–3.6)
MCH: 27.9 pg (ref 26.0–34.0)
MCHC: 32.1 g/dL (ref 32.0–36.0)
MCV: 87.1 fL (ref 80.0–100.0)
MONOS PCT: 6 %
Monocytes Absolute: 0.4 10*3/uL (ref 0.2–0.9)
Neutro Abs: 4.9 10*3/uL (ref 1.4–6.5)
Neutrophils Relative %: 66 %
Platelets: 294 10*3/uL (ref 150–440)
RBC: 4.04 MIL/uL (ref 3.80–5.20)
RDW: 14.2 % (ref 11.5–14.5)
WBC: 7.4 10*3/uL (ref 3.6–11.0)

## 2015-02-10 LAB — LIPASE, BLOOD: LIPASE: 26 U/L (ref 22–51)

## 2015-02-10 LAB — PREGNANCY, URINE: PREG TEST UR: NEGATIVE

## 2015-02-10 MED ORDER — TRAMADOL HCL 50 MG PO TABS: 50.0000 mg | ORAL_TABLET | Freq: Four times a day (QID) | ORAL | Status: DC | PRN

## 2015-02-10 NOTE — Discharge Instructions (Signed)
Abdominal Pain, Women °Abdominal (stomach, pelvic, or belly) pain can be caused by many things. It is important to tell your doctor: °· The location of the pain. °· Does it come and go or is it present all the time? °· Are there things that start the pain (eating certain foods, exercise)? °· Are there other symptoms associated with the pain (fever, nausea, vomiting, diarrhea)? °All of this is helpful to know when trying to find the cause of the pain. °CAUSES  °· Stomach: virus or bacteria infection, or ulcer. °· Intestine: appendicitis (inflamed appendix), regional ileitis (Crohn's disease), ulcerative colitis (inflamed colon), irritable bowel syndrome, diverticulitis (inflamed diverticulum of the colon), or cancer of the stomach or intestine. °· Gallbladder disease or stones in the gallbladder. °· Kidney disease, kidney stones, or infection. °· Pancreas infection or cancer. °· Fibromyalgia (pain disorder). °· Diseases of the female organs: °¨ Uterus: fibroid (non-cancerous) tumors or infection. °¨ Fallopian tubes: infection or tubal pregnancy. °¨ Ovary: cysts or tumors. °¨ Pelvic adhesions (scar tissue). °¨ Endometriosis (uterus lining tissue growing in the pelvis and on the pelvic organs). °¨ Pelvic congestion syndrome (female organs filling up with blood just before the menstrual period). °¨ Pain with the menstrual period. °¨ Pain with ovulation (producing an egg). °¨ Pain with an IUD (intrauterine device, birth control) in the uterus. °¨ Cancer of the female organs. °· Functional pain (pain not caused by a disease, may improve without treatment). °· Psychological pain. °· Depression. °DIAGNOSIS  °Your doctor will decide the seriousness of your pain by doing an examination. °· Blood tests. °· X-rays. °· Ultrasound. °· CT scan (computed tomography, special type of X-ray). °· MRI (magnetic resonance imaging). °· Cultures, for infection. °· Barium enema (dye inserted in the large intestine, to better view it with  X-rays). °· Colonoscopy (looking in intestine with a lighted tube). °· Laparoscopy (minor surgery, looking in abdomen with a lighted tube). °· Major abdominal exploratory surgery (looking in abdomen with a large incision). °TREATMENT  °The treatment will depend on the cause of the pain.  °· Many cases can be observed and treated at home. °· Over-the-counter medicines recommended by your caregiver. °· Prescription medicine. °· Antibiotics, for infection. °· Birth control pills, for painful periods or for ovulation pain. °· Hormone treatment, for endometriosis. °· Nerve blocking injections. °· Physical therapy. °· Antidepressants. °· Counseling with a psychologist or psychiatrist. °· Minor or major surgery. °HOME CARE INSTRUCTIONS  °· Do not take laxatives, unless directed by your caregiver. °· Take over-the-counter pain medicine only if ordered by your caregiver. Do not take aspirin because it can cause an upset stomach or bleeding. °· Try a clear liquid diet (broth or water) as ordered by your caregiver. Slowly move to a bland diet, as tolerated, if the pain is related to the stomach or intestine. °· Have a thermometer and take your temperature several times a day, and record it. °· Bed rest and sleep, if it helps the pain. °· Avoid sexual intercourse, if it causes pain. °· Avoid stressful situations. °· Keep your follow-up appointments and tests, as your caregiver orders. °· If the pain does not go away with medicine or surgery, you may try: °¨ Acupuncture. °¨ Relaxation exercises (yoga, meditation). °¨ Group therapy. °¨ Counseling. °SEEK MEDICAL CARE IF:  °· You notice certain foods cause stomach pain. °· Your home care treatment is not helping your pain. °· You need stronger pain medicine. °· You want your IUD removed. °· You feel faint or   lightheaded. °· You develop nausea and vomiting. °· You develop a rash. °· You are having side effects or an allergy to your medicine. °SEEK IMMEDIATE MEDICAL CARE IF:  °· Your  pain does not go away or gets worse. °· You have a fever. °· Your pain is felt only in portions of the abdomen. The right side could possibly be appendicitis. The left lower portion of the abdomen could be colitis or diverticulitis. °· You are passing blood in your stools (bright red or black tarry stools, with or without vomiting). °· You have blood in your urine. °· You develop chills, with or without a fever. °· You pass out. °MAKE SURE YOU:  °· Understand these instructions. °· Will watch your condition. °· Will get help right away if you are not doing well or get worse. °Document Released: 07/03/2007 Document Revised: 01/20/2014 Document Reviewed: 07/23/2009 °[employer]® Patient Information ©2015 [employer]. This information is not intended to replace advice given to you by your health care provider. Make sure you discuss any questions you have with your health care provider. ° °

## 2015-02-10 NOTE — ED Notes (Signed)
Brought over from [facility] with lower abd pain and back pain

## 2015-02-10 NOTE — ED Provider Notes (Signed)
[facility] Emergency Department Provider Note  Time seen: 6:16 PM  I have reviewed the triage vital signs and the nursing notes.   HISTORY  Chief Complaint Abdominal Pain    HPI Victoria Gibbs is a 24 y.o. female with a past medical history of hypertension and obesity presents the emergency department with lower abdominal pain 3 days and now with lower back pain. According to the patient she has had a dull lower abdominal pain for the past 3 days which has gradually worsened. Today she also noted some pain to her bilateral lower back. Denies fever, denies nausea/vomiting/diarrhea, denies dysuria, cloudy urine, or foul smell to her urine. Patient denies vaginal bleeding or vaginal discharge. [facility] for evaluation a center to the emergency department for an expedient evaluation.     Past Medical History  Diagnosis Date  . Obesity   . Hypertension     Patient Active Problem List   Diagnosis Date Noted  . Irregular periods 04/21/2014  . Encounter for routine gynecological examination 04/21/2014  . Screening for STD (sexually transmitted disease) 04/21/2014  . Morbid obesity 02/27/2013  . Headache(784.0) 02/27/2013    History reviewed. No pertinent past surgical history.  Current Outpatient Rx  Name  Route  Sig  Dispense  Refill  . hydrochlorothiazide (HYDRODIURIL) 25 MG tablet   Oral   Take 1 tablet (25 mg total) by mouth daily.   90 tablet   3   . metroNIDAZOLE (METROGEL) 0.75 % vaginal gel   Vaginal   Place 1 Applicatorful vaginally 2 (two) times daily.   70 g   0     Allergies Review of patient's allergies indicates no known allergies.  Family History  Problem Relation Age of Onset  . Hypertension Mother     Social History History  Substance Use Topics  . Smoking status: Never Smoker   . Smokeless tobacco: Not on file  . Alcohol Use: Yes     Comment: occasional    Review of  Systems Constitutional: Negative for fever. Cardiovascular: Negative for chest pain. Respiratory: Negative for shortness of breath. Gastrointestinal: Positive for lower abdominal pain, negative for nausea/vomiting/diarrhea. Genitourinary: Negative for dysuria. Musculoskeletal: Positive for lower back pain. Skin: Negative for rash. 10-point ROS otherwise negative.  ____________________________________________   PHYSICAL EXAM:  VITAL SIGNS: ED Triage Vitals  Enc Vitals Group     BP 02/10/15 1745 124/75 mmHg     Pulse Rate 02/10/15 1745 94     Resp 02/10/15 1745 20     Temp 02/10/15 1745 98.4 F (36.9 C)     Temp Source 02/10/15 1745 Oral     SpO2 02/10/15 1745 98 %     Weight 02/10/15 1745 315 lb (142.883 kg)     Height 02/10/15 1745 5\' 9"  (1.753 m)     Head Cir --      Peak Flow --      Pain Score 02/10/15 1746 5     Pain Loc --      Pain Edu? --      Excl. in GC? --     Constitutional: Alert and oriented. Well appearing and in no distress. ENT   Mouth/Throat: Mucous membranes are moist. Cardiovascular: Normal rate, regular rhythm. No murmurs Respiratory: Normal respiratory effort without tachypnea nor retractions. Breath sounds are clear  Gastrointestinal: Soft, mild suprapubic tenderness to palpation, no rebound no guarding, no CVA tenderness to palpation. Musculoskeletal: Nontender with normal range of  motion in all extremities.  Neurologic:  Normal speech and language. No gross focal neurologic deficits  Skin:  Skin is warm, dry and intact.  Psychiatric: Mood and affect are normal. Speech and behavior are normal.   ____________________________________________   INITIAL IMPRESSION / ASSESSMENT AND PLAN / ED COURSE  Pertinent labs & imaging results that were available during my care of the patient were reviewed by me and considered in my medical decision making (see chart for details).  Lower abdominal pain 3 days now with lower back pain. We will check labs  including urinalysis. Patient denies any vaginal symptoms. Minimal tenderness on exam, no CVA tenderness on exam. Overall very well appearing.  ----------------------------------------- 8:06 PM on 02/10/2015 -----------------------------------------  Labs are within normal limits, negative urinalysis, negative pregnancy test. Discussed results with patient will discharge on [facility] and primary care follow-up. Patient is return to the emergency department for fever, worsening abdominal pain or any other personally concerning symptoms. Patient agreeable to plan.  ____________________________________________   FINAL CLINICAL IMPRESSION(S) / ED DIAGNOSES  Abdominal pain   Minna Antis, MD 02/10/15 2007

## 2015-02-10 NOTE — ED Notes (Signed)
Brought over from [facility] with lower abd pain /back pain

## 2015-06-08 ENCOUNTER — Ambulatory Visit (INDEPENDENT_AMBULATORY_CARE_PROVIDER_SITE_OTHER): Payer: BLUE CROSS/BLUE SHIELD | Admitting: Family Medicine

## 2015-06-08 ENCOUNTER — Encounter: Payer: Self-pay | Admitting: Family Medicine

## 2015-06-08 VITALS — BP 112/68 | HR 81 | Temp 98.5°F | Wt 317.2 lb

## 2015-06-08 DIAGNOSIS — N926 Irregular menstruation, unspecified: Secondary | ICD-10-CM | POA: Diagnosis not present

## 2015-06-08 DIAGNOSIS — Z3201 Encounter for pregnancy test, result positive: Secondary | ICD-10-CM | POA: Diagnosis not present

## 2015-06-08 DIAGNOSIS — R300 Dysuria: Secondary | ICD-10-CM | POA: Diagnosis not present

## 2015-06-08 LAB — POCT URINALYSIS DIPSTICK
BILIRUBIN UA: NEGATIVE
GLUCOSE UA: NEGATIVE
Ketones, UA: NEGATIVE
Nitrite, UA: NEGATIVE
Protein, UA: POSITIVE
SPEC GRAV UA: 1.025
pH, UA: 6

## 2015-06-08 LAB — POCT URINE PREGNANCY: Preg Test, Ur: POSITIVE — AB

## 2015-06-08 NOTE — Patient Instructions (Signed)
Good to see you. Please start taking a prenatal vitamin daily.  You are [redacted] weeks pregnant.  Congratulations.  Please stop by to see Shirlee Limerick on your way out to set up your OB referral.

## 2015-06-08 NOTE — Assessment & Plan Note (Signed)
Refer to GYN. Advised taking prenatal vitamin daily.  Only LE and RBC on urine micro.  Will send for cx, treat for UTI if positive. The patient indicates understanding of these issues and agrees with the plan.

## 2015-06-08 NOTE — Progress Notes (Signed)
   Subjective:   Patient ID: Victoria Gibbs, female    DOB: 06-02-1991, 24 y.o.   MRN: [id number]  Victoria Gibbs is a pleasant 24 y.o. year old female who presents to clinic today with Flank Pain; Dysuria; and Vaginal Bleeding  on 06/08/2015  HPI:  Feels "off."   LMP 04/18/2015 Has been experiencing intermittent vaginal spotting , spotting now. Nauseated, not vomiting. Fatigued with breast tenderness.  Increased urinary frequency, occasional dysuria for weeks. Sexually active with fiance.  Not using any contraception.  No current outpatient prescriptions on file prior to visit.   No current facility-administered medications on file prior to visit.    No Known Allergies  Past Medical History  Diagnosis Date  . Obesity   . Hypertension     No past surgical history on file.  Family History  Problem Relation Age of Onset  . Hypertension Mother     Social History   Social History  . Marital Status: Single    Spouse Name: N/A  . Number of Children: N/A  . Years of Education: N/A   Occupational History  . Not on file.   Social History Main Topics  . Smoking status: Never Smoker   . Smokeless tobacco: Not on file  . Alcohol Use: Yes     Comment: occasional  . Drug Use: Not on file  . Sexual Activity: Not on file   Other Topics Concern  . Not on file   Social History Narrative   The PMH, PSH, Social History, Family History, Medications, and allergies have been reviewed in [facility], and have been updated if relevant.  Review of Systems  Constitutional: Positive for fatigue.  Gastrointestinal: Positive for nausea. Negative for vomiting.  Endocrine: Negative.   Genitourinary: Positive for frequency and vaginal bleeding.  Allergic/Immunologic: Negative.   Neurological: Negative.   Hematological: Negative.   Psychiatric/Behavioral: Negative.   All other systems reviewed and are negative.      Objective:    BP 112/68 mmHg  Pulse 81  Temp(Src) 98.5 F (36.9  C) (Oral)  Wt 317 lb 4 oz (143.904 kg)  SpO2 98%   Physical Exam  Constitutional: She is oriented to person, place, and time. She appears well-developed and well-nourished. No distress.  HENT:  Head: Normocephalic and atraumatic.  Eyes: Conjunctivae are normal.  Neck: Normal range of motion.  Cardiovascular: Normal rate.   Pulmonary/Chest: Effort normal.  Musculoskeletal: Normal range of motion. She exhibits no edema.  Neurological: She is alert and oriented to person, place, and time. No cranial nerve deficit.  Skin: Skin is warm and dry.  Psychiatric: She has a normal mood and affect. Her behavior is normal. Judgment and thought content normal.  Nursing note and vitals reviewed.         Assessment & Plan:   Dysuria - Plan: Urinalysis Dipstick, Urine culture  Abnormal menstrual periods - Plan: hCG, quantitative, pregnancy No Follow-up on file.

## 2015-06-08 NOTE — Progress Notes (Signed)
Pre visit review using our clinic review tool, if applicable. No additional management support is needed unless otherwise documented below in the visit note. 

## 2015-06-08 NOTE — Addendum Note (Signed)
Addended by: Desmond Dike on: 06/08/2015 09:28 AM   Modules accepted: Orders

## 2015-06-09 LAB — URINE CULTURE
COLONY COUNT: NO GROWTH
Organism ID, Bacteria: NO GROWTH

## 2015-06-11 ENCOUNTER — Encounter: Payer: Self-pay | Admitting: Emergency Medicine

## 2015-06-11 DIAGNOSIS — O209 Hemorrhage in early pregnancy, unspecified: Secondary | ICD-10-CM | POA: Diagnosis present

## 2015-06-11 DIAGNOSIS — O10011 Pre-existing essential hypertension complicating pregnancy, first trimester: Secondary | ICD-10-CM | POA: Insufficient documentation

## 2015-06-11 DIAGNOSIS — O200 Threatened abortion: Secondary | ICD-10-CM | POA: Diagnosis not present

## 2015-06-11 DIAGNOSIS — Z3A01 Less than 8 weeks gestation of pregnancy: Secondary | ICD-10-CM | POA: Insufficient documentation

## 2015-06-11 NOTE — ED Notes (Signed)
Pt presents to ED with abnormal vaginal bleeding during early pregnancy. Pt states she thought she had started her period with light spotting the beginning of this month but after several weeks she had not stopped bleeding. Monday pt was seen by her pcp and given a pregnancy test; results positive. Pt states she has continued to spot light pink since then but today her bleeding increased slightly. Denies any cramping or low back or low abd pain. Pt alert and calm at this time with no distress noted. First pregnancy.

## 2015-06-12 ENCOUNTER — Emergency Department: Payer: BLUE CROSS/BLUE SHIELD

## 2015-06-12 ENCOUNTER — Emergency Department
Admission: EM | Admit: 2015-06-12 | Discharge: 2015-06-12 | Disposition: A | Discharge status: Home / Self Care | Payer: BLUE CROSS/BLUE SHIELD | Attending: Emergency Medicine | Admitting: Emergency Medicine

## 2015-06-12 DIAGNOSIS — O200 Threatened abortion: Secondary | ICD-10-CM

## 2015-06-12 DIAGNOSIS — N939 Abnormal uterine and vaginal bleeding, unspecified: Secondary | ICD-10-CM

## 2015-06-12 LAB — HCG, QUANTITATIVE, PREGNANCY: HCG, BETA CHAIN, QUANT, S: 56416 m[IU]/mL — AB (ref <5)

## 2015-06-12 LAB — ABO/RH: ABO/RH(D): A POS

## 2015-06-12 NOTE — Discharge Instructions (Signed)
Threatened Miscarriage °A threatened miscarriage occurs when you have vaginal bleeding during your first 20 weeks of pregnancy but the pregnancy has not ended. If you have vaginal bleeding during this time, your health care provider will do tests to make sure you are still pregnant. If the tests show you are still pregnant and the developing baby (fetus) inside your womb (uterus) is still growing, your condition is considered a threatened miscarriage. °A threatened miscarriage does not mean your pregnancy will end, but it does increase the risk of losing your pregnancy (complete miscarriage). °CAUSES  °The cause of a threatened miscarriage is usually not known. If you go on to have a complete miscarriage, the most common cause is an abnormal number of chromosomes in the developing baby. Chromosomes are the structures inside cells that hold all your genetic material. °Some causes of vaginal bleeding that do not result in miscarriage include: °· Having sex. °· Having an infection. °· Normal hormone changes of pregnancy. °· Bleeding that occurs when an egg implants in your uterus. °RISK FACTORS °Risk factors for bleeding in early pregnancy include: °· Obesity. °· Smoking. °· Drinking excessive amounts of alcohol or caffeine. °· Recreational drug use. °SIGNS AND SYMPTOMS °· Light vaginal bleeding. °· Mild abdominal pain or cramps. °DIAGNOSIS  °If you have bleeding with or without abdominal pain before 20 weeks of pregnancy, your health care provider will do tests to check whether you are still pregnant. One important test involves using sound waves and a computer (ultrasound) to create images of the inside of your uterus. Other tests include an internal exam of your vagina and uterus (pelvic exam) and measurement of your baby's heart rate.  °You may be diagnosed with a threatened miscarriage if: °· Ultrasound testing shows you are still pregnant. °· Your baby's heart rate is strong. °· A pelvic exam shows that the  opening between your uterus and your vagina (cervix) is closed. °· Your heart rate and blood pressure are stable. °· Blood tests confirm you are still pregnant. °TREATMENT  °No treatments have been shown to prevent a threatened miscarriage from going on to a complete miscarriage. However, the right home care is important.  °HOME CARE INSTRUCTIONS  °· Make sure you keep all your appointments for prenatal care. This is very important. °· Get plenty of rest. °· Do not have sex or use tampons if you have vaginal bleeding. °· Do not douche. °· Do not smoke or use recreational drugs. °· Do not drink alcohol. °· Avoid caffeine. °SEEK MEDICAL CARE IF: °· You have light vaginal bleeding or spotting while pregnant. °· You have abdominal pain or cramping. °· You have a fever. °SEEK IMMEDIATE MEDICAL CARE IF: °· You have heavy vaginal bleeding. °· You have blood clots coming from your vagina. °· You have severe low back pain or abdominal cramps. °· You have fever, chills, and severe abdominal pain. °MAKE SURE YOU: °· Understand these instructions. °· Will watch your condition. °· Will get help right away if you are not doing well or get worse. °Document Released: 09/05/2005 Document Revised: 09/10/2013 Document Reviewed: 07/02/2013 °ExitCare® Patient Information ©2015 [employer]. This information is not intended to replace advice given to you by your health care provider. Make sure you discuss any questions you have with your health care provider. ° °Vaginal Bleeding During Pregnancy, First Trimester °A small amount of bleeding (spotting) from the vagina is relatively common in early pregnancy. It usually stops on its own. Various things may cause bleeding   or spotting in early pregnancy. Some bleeding may be related to the pregnancy, and some may not. In most cases, the bleeding is normal and is not a problem. However, bleeding can also be a sign of something serious. Be sure to tell your health care provider about any  vaginal bleeding right away. °Some possible causes of vaginal bleeding during the first trimester include: °· Infection or inflammation of the cervix. °· Growths (polyps) on the cervix. °· Miscarriage or threatened miscarriage. °· Pregnancy tissue has developed outside of the uterus and in a fallopian tube (tubal pregnancy). °· Tiny cysts have developed in the uterus instead of pregnancy tissue (molar pregnancy). °HOME CARE INSTRUCTIONS  °Watch your condition for any changes. The following actions may help to lessen any discomfort you are feeling: °· Follow your health care provider's instructions for limiting your activity. If your health care provider orders bed rest, you may need to stay in bed and only get up to use the bathroom. However, your health care provider may allow you to continue light activity. °· If needed, make plans for someone to help with your regular activities and responsibilities while you are on bed rest. °· Keep track of the number of pads you use each day, how often you change pads, and how soaked (saturated) they are. Write this down. °· Do not use tampons. Do not douche. °· Do not have sexual intercourse or orgasms until approved by your health care provider. °· If you pass any tissue from your vagina, save the tissue so you can show it to your health care provider. °· Only take over-the-counter or prescription medicines as directed by your health care provider. °· Do not take aspirin because it can make you bleed. °· Keep all follow-up appointments as directed by your health care provider. °SEEK MEDICAL CARE IF: °· You have any vaginal bleeding during any part of your pregnancy. °· You have cramps or labor pains. °· You have a fever, not controlled by medicine. °SEEK IMMEDIATE MEDICAL CARE IF:  °· You have severe cramps in your back or belly (abdomen). °· You pass large clots or tissue from your vagina. °· Your bleeding increases. °· You feel light-headed or weak, or you have fainting  episodes. °· You have chills. °· You are leaking fluid or have a gush of fluid from your vagina. °· You pass out while having a bowel movement. °MAKE SURE YOU: °· Understand these instructions. °· Will watch your condition. °· Will get help right away if you are not doing well or get worse. °Document Released: 06/15/2005 Document Revised: 09/10/2013 Document Reviewed: 05/13/2013 °ExitCare® Patient Information ©2015 [employer]. This information is not intended to replace advice given to you by your health care provider. Make sure you discuss any questions you have with your health care provider. ° °

## 2015-06-12 NOTE — ED Notes (Signed)
Called the lab about the ABO/Rh-  She said about 5 minutes and it should be resulted.

## 2015-06-12 NOTE — ED Provider Notes (Signed)
[facility] Emergency Department Provider Note  ____________________________________________  Time seen: 2:30 AM  I have reviewed the triage vital signs and the nursing notes.   HISTORY  Chief Complaint Vaginal Bleeding    HPI Victoria Gibbs is a 24 y.o. female who reports having spotting for the past 2-3 weeks off and on. Recently had a positive pregnancy test by her primary care doctor. No abdominal pain back pain or cramping or contractions. No fever chills chest pain or shortness of breath. No dysuria frequency urgency or vaginal discharge. This is her first pregnancy. She is about 4-[redacted] weeks along     Past Medical History  Diagnosis Date  . Obesity   . Hypertension      Patient Active Problem List   Diagnosis Date Noted  . Abnormal menstrual periods 06/08/2015  . Dysuria 06/08/2015  . Positive pregnancy test 06/08/2015  . Irregular periods 04/21/2014  . Encounter for routine gynecological examination 04/21/2014  . Screening for STD (sexually transmitted disease) 04/21/2014  . Morbid obesity 02/27/2013  . Headache(784.0) 02/27/2013     History reviewed. No pertinent past surgical history.   No current outpatient prescriptions on file. Prenatal vitamins  Allergies Review of patient's allergies indicates no known allergies.   Family History  Problem Relation Age of Onset  . Hypertension Mother     Social History Social History  Substance Use Topics  . Smoking status: Never Smoker   . Smokeless tobacco: None  . Alcohol Use: No     Comment: occasional    Review of Systems  Constitutional:   No fever or chills. No weight changes Eyes:   No blurry vision or double vision.  ENT:   No sore throat. Cardiovascular:   No chest pain. Respiratory:   No dyspnea or cough. Gastrointestinal:   Negative for abdominal pain, vomiting and diarrhea.  No BRBPR or melena. Genitourinary:   Negative for dysuria, urinary retention, bloody  urine, or difficulty urinating. Vaginal bleeding as above Musculoskeletal:   Negative for back pain. No joint swelling or pain. Skin:   Negative for rash. Neurological:   Negative for headaches, focal weakness or numbness. Psychiatric:  No anxiety or depression.   Endocrine:  No hot/cold intolerance, changes in energy, or sleep difficulty.  10-point ROS otherwise negative.  ____________________________________________   PHYSICAL EXAM:  VITAL SIGNS: ED Triage Vitals  Enc Vitals Group     BP 06/11/15 2338 139/73 mmHg     Pulse Rate 06/11/15 2338 70     Resp 06/11/15 2338 18     Temp 06/11/15 2338 98.6 F (37 C)     Temp Source 06/11/15 2338 Oral     SpO2 06/11/15 2338 100 %     Weight 06/11/15 2338 315 lb (142.883 kg)     Height 06/11/15 2338  (1.753 m)     Head Cir --      Peak Flow --      Pain Score 06/11/15 2346 0     Pain Loc --      Pain Edu? --      Excl. in GC? --      Constitutional:   Alert and oriented. Well appearing and in no distress. Eyes:   No scleral icterus. No conjunctival pallor. PERRL. EOMI ENT   Head:   Normocephalic and atraumatic.   Nose:   No congestion/rhinnorhea. No septal hematoma   Mouth/Throat:   MMM, no pharyngeal erythema. No peritonsillar mass. No uvula shift.  Neck:   No stridor. No SubQ emphysema. No meningismus. Hematological/Lymphatic/Immunilogical:   No cervical lymphadenopathy. Cardiovascular:   RRR. Normal and symmetric distal pulses are present in all extremities. No murmurs, rubs, or gallops. Respiratory:   Normal respiratory effort without tachypnea nor retractions. Breath sounds are clear and equal bilaterally. No wheezes/rales/rhonchi. Gastrointestinal:   Soft and nontender. No distention. There is no CVA tenderness.  No rebound, rigidity, or guarding. Genitourinary:   deferred Musculoskeletal:   Nontender with normal range of motion in all extremities. No joint effusions.  No lower extremity tenderness.  No  edema. Neurologic:   Normal speech and language.  CN 2-10 normal. Motor grossly intact. No pronator drift.  Normal gait. No gross focal neurologic deficits are appreciated.  Skin:    Skin is warm, dry and intact. No rash noted.  No petechiae, purpura, or bullae. Psychiatric:   Mood and affect are normal. Speech and behavior are normal. Patient exhibits appropriate insight and judgment.  ____________________________________________    LABS (pertinent positives/negatives) (all labs ordered are listed, but only abnormal results are displayed) Labs Reviewed  HCG, QUANTITATIVE, PREGNANCY - Abnormal; Notable for the following:    hCG, Beta Chain, Quant, [place] [id number] (*)    All other components within normal limits  ABO/RH   ____________________________________________   EKG    ____________________________________________    RADIOLOGY  Pelvic ultrasound reveals IUP within EGA of [redacted] weeks 6 days. Fetal heart rate in the 130s  ____________________________________________   PROCEDURES   ____________________________________________   INITIAL IMPRESSION / ASSESSMENT AND PLAN / ED COURSE  Pertinent labs & imaging results that were available during my care of the patient were reviewed by me and considered in my medical decision making (see chart for details).  Patient presents with threatened miscarriage. No ectopic. Rh+. We'll discharge home and have her follow up with primary care in 3 days for blood recheck.     ____________________________________________   FINAL CLINICAL IMPRESSION(S) / ED DIAGNOSES  Final diagnoses:  Threatened miscarriage      Sharman Cheek, MD 06/12/15 [phone]

## 2015-06-12 NOTE — ED Notes (Signed)
Pt denies pain at this time but reports vaginal bleeding x 1 month. Pt reports spotting at the beginning of the month. Bleeding increased today. Pt in no acute distress upon assessment.

## 2015-06-15 ENCOUNTER — Ambulatory Visit: Payer: BLUE CROSS/BLUE SHIELD | Admitting: Obstetrics and Gynecology

## 2015-06-15 VITALS — BP 109/61 | HR 66 | Wt 313.1 lb

## 2015-06-15 DIAGNOSIS — Z3491 Encounter for supervision of normal pregnancy, unspecified, first trimester: Secondary | ICD-10-CM

## 2015-06-15 MED ORDER — DOXYLAMINE-PYRIDOXINE 10-10 MG PO TBEC: 1.0000 | DELAYED_RELEASE_TABLET | Freq: Two times a day (BID) | ORAL | Status: DC

## 2015-06-15 NOTE — Progress Notes (Cosign Needed)
Pt is here for NOB nurse intake, went to ER 06/12/15 see [place] report, NOB packet was given to patient, we discussed Zika virus, answered all questions

## 2015-06-16 ENCOUNTER — Other Ambulatory Visit: Payer: BLUE CROSS/BLUE SHIELD

## 2015-06-16 DIAGNOSIS — Z3491 Encounter for supervision of normal pregnancy, unspecified, first trimester: Secondary | ICD-10-CM

## 2015-06-16 LAB — OB RESULTS CONSOLE VARICELLA ZOSTER ANTIBODY, IGG: VARICELLA IGG: IMMUNE

## 2015-06-17 LAB — TSH: TSH: 1.92 u[IU]/mL (ref 0.450–4.500)

## 2015-06-17 LAB — CBC WITH DIFFERENTIAL/PLATELET
BASOS: 1 %
Basophils Absolute: 0 10*3/uL (ref 0.0–0.2)
EOS (ABSOLUTE): 0.1 10*3/uL (ref 0.0–0.4)
Eos: 1 %
Hematocrit: 34.1 % (ref 34.0–46.6)
Hemoglobin: 11.2 g/dL (ref 11.1–15.9)
IMMATURE GRANS (ABS): 0 10*3/uL (ref 0.0–0.1)
Immature Granulocytes: 0 %
LYMPHS: 30 %
Lymphocytes Absolute: 1.7 10*3/uL (ref 0.7–3.1)
MCH: 28.1 pg (ref 26.6–33.0)
MCHC: 32.8 g/dL (ref 31.5–35.7)
MCV: 86 fL (ref 79–97)
MONOS ABS: 0.4 10*3/uL (ref 0.1–0.9)
Monocytes: 7 %
Neutrophils Absolute: 3.5 10*3/uL (ref 1.4–7.0)
Neutrophils: 61 %
Platelets: 341 10*3/uL (ref 150–379)
RBC: 3.98 x10E6/uL (ref 3.77–5.28)
RDW: 14.5 % (ref 12.3–15.4)
WBC: 5.7 10*3/uL (ref 3.4–10.8)

## 2015-06-17 LAB — HEP, RPR, HIV PANEL
HIV Screen 4th Generation wRfx: NONREACTIVE
Hepatitis B Surface Ag: NEGATIVE
RPR: NONREACTIVE

## 2015-06-17 LAB — MICROSCOPIC EXAMINATION
Casts: NONE SEEN /lpf
Epithelial Cells (non renal): >10 /hpf — AB (ref 0–10)

## 2015-06-17 LAB — URINALYSIS, ROUTINE W REFLEX MICROSCOPIC
Bilirubin, UA: NEGATIVE
Glucose, UA: NEGATIVE
Ketones, UA: NEGATIVE
NITRITE UA: NEGATIVE
PH UA: 7.5 (ref 5.0–7.5)
PROTEIN UA: NEGATIVE
RBC, UA: NEGATIVE
Specific Gravity, UA: 1.021 (ref 1.005–1.030)
UUROB: 1 mg/dL (ref 0.2–1.0)

## 2015-06-17 LAB — URINE CULTURE

## 2015-06-17 LAB — ABO AND RH: RH TYPE: POSITIVE

## 2015-06-17 LAB — RUBELLA SCREEN: RUBELLA: 5.18 {index} (ref >0.99)

## 2015-06-17 LAB — GC/CHLAMYDIA PROBE AMP
Chlamydia trachomatis, NAA: NEGATIVE
Neisseria gonorrhoeae by PCR: NEGATIVE

## 2015-06-17 LAB — SICKLE CELL SCREEN: SICKLE CELL SCREEN: NEGATIVE

## 2015-06-17 LAB — VARICELLA ZOSTER ANTIBODY, IGG: Varicella zoster IgG: 204 index (ref >165)

## 2015-06-17 LAB — ANTIBODY SCREEN: Antibody Screen: NEGATIVE

## 2015-06-17 LAB — HEMOGLOBIN A1C
Est. average glucose Bld gHb Est-mCnc: 123 mg/dL
HEMOGLOBIN A1C: 5.9 % — AB (ref 4.8–5.6)

## 2015-06-23 ENCOUNTER — Other Ambulatory Visit: Payer: Self-pay | Admitting: Obstetrics and Gynecology

## 2015-06-24 ENCOUNTER — Telehealth: Payer: Self-pay | Admitting: Obstetrics and Gynecology

## 2015-06-24 ENCOUNTER — Telehealth: Payer: Self-pay | Admitting: *Deleted

## 2015-06-24 NOTE — Telephone Encounter (Signed)
Work note given to pt for 06/24/15

## 2015-06-24 NOTE — Telephone Encounter (Signed)
Called pt she is having some bright red bleeding, not a lot, advised pt to let us know if she soaked a pad in less than 1 hr and to hydrate and if bleeding got worse to er pt voiced understanding

## 2015-06-24 NOTE — Telephone Encounter (Signed)
Pt was here last week and had labs done and nurse ob intake. She said she would get a call about her next appt. And she woke up this morn and was spotting. Has to be at work at 3 pm and don't know whether she should go or not.

## 2015-07-15 ENCOUNTER — Encounter: Payer: Self-pay | Admitting: Obstetrics and Gynecology

## 2015-07-15 ENCOUNTER — Ambulatory Visit (INDEPENDENT_AMBULATORY_CARE_PROVIDER_SITE_OTHER): Payer: BLUE CROSS/BLUE SHIELD | Admitting: Obstetrics and Gynecology

## 2015-07-15 VITALS — BP 115/66 | HR 80 | Wt 302.0 lb

## 2015-07-15 DIAGNOSIS — Z3491 Encounter for supervision of normal pregnancy, unspecified, first trimester: Secondary | ICD-10-CM

## 2015-07-15 LAB — POCT URINALYSIS DIPSTICK
Glucose, UA: NEGATIVE
Ketones, UA: NEGATIVE
Leukocytes, UA: NEGATIVE
NITRITE UA: NEGATIVE
PH UA: 6
RBC UA: NEGATIVE
Spec Grav, UA: 1.02
UROBILINOGEN UA: 0.2

## 2015-07-15 NOTE — Progress Notes (Signed)
NOB-pt is c/o nausea states the diclegis isn't helping

## 2015-07-15 NOTE — Patient Instructions (Signed)

## 2015-07-15 NOTE — Progress Notes (Signed)
NEW OB HISTORY AND PHYSICAL  SUBJECTIVE:       Victoria Gibbs is a 24 y.o. G1P0 female, Patient's last menstrual period was 04/24/2015., Estimated Date of Delivery: 01/29/16, [id number], presents today for establishment of Prenatal Care. She has no unusual complaints and complains of nausea with vomiting every couple days      Gynecologic History Patient's last menstrual period was 04/24/2015. Normal Contraception: none Last Pap: 2015. Results were: normal  Obstetric History OB History  Gravida Para Term Preterm AB SAB TAB Ectopic Multiple Living  1             # Outcome Date GA Lbr Len/2nd Weight Sex Delivery Anes PTL Lv  1 Current               Past Medical History  Diagnosis Date  . Obesity   . Hypertension     History reviewed. No pertinent past surgical history.  Current Outpatient Prescriptions on File Prior to Visit  Medication Sig Dispense Refill  . Doxylamine-Pyridoxine 10-10 MG TBEC Take 1 tablet by mouth 2 (two) times daily. 60 tablet 1  . Prenatal Vit-Fe Fumarate-FA (PRENATAL MULTIVITAMIN) TABS tablet Take 1 tablet by mouth daily at 12 noon.     No current facility-administered medications on file prior to visit.    No Known Allergies  Social History   Social History  . Marital Status: Single    Spouse Name: N/A  . Number of Children: N/A  . Years of Education: N/A   Occupational History  . Not on file.   Social History Main Topics  . Smoking status: Never Smoker   . Smokeless tobacco: Never Used  . Alcohol Use: No     Comment: occasional  . Drug Use: No  . Sexual Activity: Yes    Birth Control/ Protection: None   Other Topics Concern  . Not on file   Social History Narrative    Family History  Problem Relation Age of Onset  . Hypertension Mother   . Diabetes Brother     The following portions of the patient's history were reviewed and updated as appropriate: allergies, current medications, past OB history, past medical history, past  surgical history, past family history, past social history, and problem list.    OBJECTIVE: Initial Physical Exam (New OB)  GENERAL APPEARANCE: alert, well appearing, in no apparent distress, oriented to person, place and time, anxious HEAD: normocephalic, atraumatic MOUTH: mucous membranes moist, pharynx normal without lesions THYROID: no thyromegaly or masses present BREASTS: no masses noted, no significant tenderness, no palpable axillary nodes, no skin changes LUNGS: clear to auscultation, no wheezes, rales or rhonchi, symmetric air entry HEART: regular rate and rhythm, no murmurs ABDOMEN: soft, nontender, nondistended, no abnormal masses, no epigastric pain, fundus not palpable and FHT present EXTREMITIES: no redness or tenderness in the calves or thighs, no edema SKIN: normal coloration and turgor, no rashes LYMPH NODES: no adenopathy palpable NEUROLOGIC: alert, oriented, normal speech, no focal findings or movement disorder noted  PELVIC EXAM EXTERNAL GENITALIA: normal appearing vulva with no masses, tenderness or lesions UTERUS: gravid and consistent with 13 weeks  ASSESSMENT: Normal pregnancy Morbid obesity  PLAN: Prenatal care  genetic screening desired,  Flu vaccine given Discussed weight gain in pregnancy and early glucola- will do next visit. See orders

## 2015-07-23 ENCOUNTER — Other Ambulatory Visit: Payer: BLUE CROSS/BLUE SHIELD

## 2015-07-29 ENCOUNTER — Telehealth: Payer: Self-pay

## 2015-07-29 NOTE — Telephone Encounter (Signed)
Pt states she is having vaginal itching. Slight d/c off white. Pos odor.  No new detergent or body wash. Advised pt to use monistat 7 d. If no better on day 9 she will need to be seen.

## 2015-08-03 ENCOUNTER — Telehealth: Payer: Self-pay | Admitting: Obstetrics and Gynecology

## 2015-08-03 NOTE — Telephone Encounter (Signed)
Pt called and she called in on Friday thinking she might have a yeast infection was told to use OTC monist ate, but is did not get better, pt ended up at a urgent care yesterday evening, and the Doctor there prescribed her some pills to take but also the doctor wanted her to call us to day and make sure she can take those pills since she is pregnant. Pt would like a call back.

## 2015-08-03 NOTE — Telephone Encounter (Signed)
Notified pt its ok to take Macrobid was rxd @ urgent care

## 2015-08-06 ENCOUNTER — Telehealth: Payer: Self-pay | Admitting: Obstetrics and Gynecology

## 2015-08-06 NOTE — Telephone Encounter (Signed)
This poor child has called every day for her Panorama results. I think she had her test drawn 2 wk ago. Will you check on it please!

## 2015-08-07 ENCOUNTER — Other Ambulatory Visit: Payer: BLUE CROSS/BLUE SHIELD

## 2015-08-07 ENCOUNTER — Encounter: Payer: Self-pay | Admitting: Obstetrics and Gynecology

## 2015-08-07 ENCOUNTER — Other Ambulatory Visit: Payer: Self-pay

## 2015-08-07 ENCOUNTER — Ambulatory Visit (INDEPENDENT_AMBULATORY_CARE_PROVIDER_SITE_OTHER): Payer: BLUE CROSS/BLUE SHIELD | Admitting: Obstetrics and Gynecology

## 2015-08-07 ENCOUNTER — Other Ambulatory Visit: Payer: Self-pay | Admitting: Obstetrics and Gynecology

## 2015-08-07 VITALS — BP 119/69 | HR 74 | Wt 303.0 lb

## 2015-08-07 DIAGNOSIS — Z3491 Encounter for supervision of normal pregnancy, unspecified, first trimester: Secondary | ICD-10-CM

## 2015-08-07 DIAGNOSIS — Z3492 Encounter for supervision of normal pregnancy, unspecified, second trimester: Secondary | ICD-10-CM

## 2015-08-07 NOTE — Progress Notes (Signed)
ROB & early glucola- doing well w/o concerns

## 2015-08-07 NOTE — Progress Notes (Signed)
ROB- pt denies any new complaints 

## 2015-08-07 NOTE — Telephone Encounter (Signed)
Notified pt kit got lost in the mail we will do redraw

## 2015-08-08 LAB — GLUCOSE, 1 HOUR GESTATIONAL: GESTATIONAL DIABETES SCREEN: 71 mg/dL (ref 65–139)

## 2015-08-17 ENCOUNTER — Encounter: Payer: Self-pay | Admitting: Obstetrics and Gynecology

## 2015-08-18 ENCOUNTER — Other Ambulatory Visit: Payer: Self-pay | Admitting: Obstetrics and Gynecology

## 2015-09-02 ENCOUNTER — Ambulatory Visit (INDEPENDENT_AMBULATORY_CARE_PROVIDER_SITE_OTHER): Payer: BLUE CROSS/BLUE SHIELD | Admitting: Certified Nurse Midwife

## 2015-09-02 ENCOUNTER — Encounter: Payer: Self-pay | Admitting: Certified Nurse Midwife

## 2015-09-02 ENCOUNTER — Ambulatory Visit (INDEPENDENT_AMBULATORY_CARE_PROVIDER_SITE_OTHER): Payer: BLUE CROSS/BLUE SHIELD

## 2015-09-02 VITALS — BP 116/76 | HR 81 | Wt 302.3 lb

## 2015-09-02 DIAGNOSIS — Z3492 Encounter for supervision of normal pregnancy, unspecified, second trimester: Secondary | ICD-10-CM | POA: Diagnosis not present

## 2015-09-02 DIAGNOSIS — Z36 Encounter for antenatal screening of mother: Secondary | ICD-10-CM

## 2015-09-02 DIAGNOSIS — O99212 Obesity complicating pregnancy, second trimester: Secondary | ICD-10-CM | POA: Insufficient documentation

## 2015-09-02 DIAGNOSIS — Z369 Encounter for antenatal screening, unspecified: Secondary | ICD-10-CM

## 2015-09-02 DIAGNOSIS — Z331 Pregnant state, incidental: Secondary | ICD-10-CM

## 2015-09-02 DIAGNOSIS — E669 Obesity, unspecified: Secondary | ICD-10-CM

## 2015-09-02 DIAGNOSIS — Z3689 Encounter for other specified antenatal screening: Secondary | ICD-10-CM | POA: Insufficient documentation

## 2015-09-02 DIAGNOSIS — Z1389 Encounter for screening for other disorder: Secondary | ICD-10-CM

## 2015-09-02 DIAGNOSIS — Z3490 Encounter for supervision of normal pregnancy, unspecified, unspecified trimester: Secondary | ICD-10-CM

## 2015-09-02 LAB — POCT URINALYSIS DIPSTICK
Bilirubin, UA: NEGATIVE
Blood, UA: NEGATIVE
GLUCOSE UA: NEGATIVE
Ketones, UA: NEGATIVE
NITRITE UA: NEGATIVE
PROTEIN UA: NEGATIVE
SPEC GRAV UA: 1.02
UROBILINOGEN UA: NEGATIVE
pH, UA: 6

## 2015-09-02 NOTE — Patient Instructions (Signed)

## 2015-09-02 NOTE — Progress Notes (Signed)
Pt states she got flu vaccine last visit.

## 2015-09-02 NOTE — Progress Notes (Signed)
ROB-Anatomy scan today.  Unable to see some views will repeat in 2 weeks.  Diet & exercise discussed.  Patient encouraged to limit weight gain to 15 pounds maximum.  Female "Hunter"  Plans circumcision.

## 2015-09-07 ENCOUNTER — Ambulatory Visit (INDEPENDENT_AMBULATORY_CARE_PROVIDER_SITE_OTHER): Payer: BLUE CROSS/BLUE SHIELD | Admitting: Certified Nurse Midwife

## 2015-09-07 ENCOUNTER — Encounter: Payer: Self-pay | Admitting: Certified Nurse Midwife

## 2015-09-07 VITALS — BP 116/83 | HR 93 | Wt 304.0 lb

## 2015-09-07 DIAGNOSIS — R42 Dizziness and giddiness: Secondary | ICD-10-CM

## 2015-09-07 DIAGNOSIS — Z3490 Encounter for supervision of normal pregnancy, unspecified, unspecified trimester: Secondary | ICD-10-CM

## 2015-09-07 DIAGNOSIS — Z331 Pregnant state, incidental: Secondary | ICD-10-CM

## 2015-09-07 DIAGNOSIS — O99012 Anemia complicating pregnancy, second trimester: Secondary | ICD-10-CM | POA: Insufficient documentation

## 2015-09-07 LAB — POCT URINALYSIS DIPSTICK
BILIRUBIN UA: NEGATIVE
Blood, UA: NEGATIVE
GLUCOSE UA: NEGATIVE
Ketones, UA: NEGATIVE
NITRITE UA: NEGATIVE
Protein, UA: NEGATIVE
Spec Grav, UA: 1.015
UROBILINOGEN UA: NEGATIVE
pH, UA: 6

## 2015-09-07 LAB — POCT HEMOGLOBIN: HEMOGLOBIN: 9.8 g/dL — AB (ref 12.2–16.2)

## 2015-09-07 LAB — GLUCOSE, POCT (MANUAL RESULT ENTRY): POC GLUCOSE: 100 mg/dL — AB (ref 70–99)

## 2015-09-07 NOTE — Progress Notes (Signed)
Pt c/o dizziness, feeling hot, shaky while at work. Feeling better now. Ate breakfast at 11:00 which consisted of bacon/chees biscuit, hash brown, fruit punch and water (16-20oz). Before episode pt had started drinking water. C/O H/A now. Glucose finger stick: 100. Hgb: 9.8.

## 2015-09-07 NOTE — Progress Notes (Signed)
Pt c/o syncope episodes.  BS 100, HGB 9 Pt Advised to eat small snacks every 3 hours and to eat iron rich diet, and to take ferrous sulfate 325 mg TID.

## 2015-09-07 NOTE — Patient Instructions (Signed)
Eating Plan for Pregnant Women While you are pregnant, your body will require additional nutrition to help support your growing baby. It is recommended that you consume:  150 additional calories each day during your first trimester.  300 additional calories each day during your second trimester.  300 additional calories each day during your third trimester. Eating a healthy, well-balanced diet is very important for your health and for your baby's health. You also have a higher need for some vitamins and minerals, such as folic acid, calcium, iron, and vitamin D. WHAT DO I NEED TO KNOW ABOUT EATING DURING PREGNANCY?  Do not try to lose weight or go on a diet during pregnancy.  Choose healthy, nutritious foods. Choose  of a sandwich with a glass of milk instead of a candy bar or a high-calorie sugar-sweetened beverage.  Limit your overall intake of foods that have "empty calories." These are foods that have little nutritional value, such as sweets, desserts, candies, sugar-sweetened beverages, and fried foods.  Eat a variety of foods, especially fruits and vegetables.  Take a prenatal vitamin to help meet the additional needs during pregnancy, specifically for folic acid, iron, calcium, and vitamin D.  Remember to stay active. Ask your health care provider for exercise recommendations that are specific to you.  Practice good food safety and cleanliness, such as washing your hands before you eat and after you prepare raw meat. This helps to prevent foodborne illnesses, such as listeriosis, that can be very dangerous for your baby. Ask your health care provider for more information about listeriosis. WHAT DOES 150 EXTRA CALORIES LOOK LIKE? Healthy options for an additional 150 calories each day could be any of the following:  Plain low-fat yogurt (6-8 oz) with  cup of berries.  1 apple with 2 teaspoons of peanut butter.  Cut-up vegetables with  cup of hummus.  Low-fat chocolate milk  (8 oz or 1 cup).  1 string cheese with 1 medium orange.   of a peanut butter and jelly sandwich on whole-wheat bread (1 tsp of peanut butter). For 300 calories, you could eat two of those healthy options each day.  WHAT IS A HEALTHY AMOUNT OF WEIGHT TO GAIN? The recommended amount of weight for you to gain is based on your pre-pregnancy BMI. If your pre-pregnancy BMI was:  Less than 18 (underweight), you should gain 28-40 lb.  18-24.9 (normal), you should gain 25-35 lb.  25-29.9 (overweight), you should gain 15-25 lb.  Greater than 30 (obese), you should gain 11-20 lb. WHAT IF I AM HAVING TWINS OR MULTIPLES? Generally, pregnant women who will be having twins or multiples may need to increase their daily calories by 300-600 calories each day. The recommended range for total weight gain is 25-54 lb, depending on your pre-pregnancy BMI. Talk with your health care provider for specific guidance about additional nutritional needs, weight gain, and exercise during your pregnancy. WHAT FOODS CAN I EAT? Grains Any grains. Try to choose whole grains, such as whole-wheat bread, oatmeal, or brown rice. Vegetables Any vegetables. Try to eat a variety of colors and types of vegetables to get a full range of vitamins and minerals. Remember to wash your vegetables well before eating. Fruits Any fruits. Try to eat a variety of colors and types of fruit to get a full range of vitamins and minerals. Remember to wash your fruits well before eating. Meats and Other Protein Sources Lean meats, including chicken, turkey, fish, and lean cuts of beef, veal, or pork.   Make sure that all meats are cooked to "well done." Tofu. Tempeh. Beans. Eggs. Peanut butter and other nut butters. Seafood, such as shrimp, crab, and lobster. If you choose fish, select types that are higher in omega-3 fatty acids, including salmon, herring, mussels, trout, sardines, and pollock. Make sure that all meats are cooked to food-safe  temperatures. Dairy Pasteurized milk and milk alternatives. Pasteurized yogurt and pasteurized cheese. Cottage cheese. Sour cream. Beverages Water. Juices that contain 100% fruit juice or vegetable juice. Caffeine-free teas and decaffeinated coffee. Drinks that contain caffeine are okay to drink, but it is better to avoid caffeine. Keep your total caffeine intake to less than 200 mg each day (12 oz of coffee, tea, or soda) or as directed by your health care provider. Condiments Any pasteurized condiments. Sweets and Desserts Any sweets and desserts. Fats and Oils Any fats and oils. The items listed above may not be a complete list of recommended foods or beverages. Contact your dietitian for more options. WHAT FOODS ARE NOT RECOMMENDED? Vegetables Unpasteurized (raw) vegetable juices. Fruits Unpasteurized (raw) fruit juices. Meats and Other Protein Sources Cured meats that have nitrates, such as bacon, salami, and hotdogs. Luncheon meats, bologna, or other deli meats (unless they are reheated until they are steaming hot). Refrigerated pate, meat spreads from a meat counter, smoked seafood that is found in the refrigerated section of a store. Raw fish, such as sushi or sashimi. High mercury content fish, such as tilefish, shark, swordfish, and king mackerel. Raw meats, such as tuna or beef tartare. Undercooked meats and poultry. Make sure that all meats are cooked to food-safe temperatures. Dairy Unpasteurized (raw) milk and any foods that have raw milk in them. Soft cheeses, such as feta, queso blanco, queso fresco, Brie, Camembert cheeses, blue-veined cheeses, and Panela cheese (unless it is made with pasteurized milk, which must be stated on the label). Beverages Alcohol. Sugar-sweetened beverages, such as sodas, teas, or energy drinks. Condiments Homemade fermented foods and drinks, such as pickles, sauerkraut, or kombucha drinks. (Store-bought pasteurized versions of these are  okay.) Other Salads that are made in the store, such as ham salad, chicken salad, egg salad, tuna salad, and seafood salad. The items listed above may not be a complete list of foods and beverages to avoid. Contact your dietitian for more information.   This information is not intended to replace advice given to you by your health care provider. Make sure you discuss any questions you have with your health care provider.   Document Released: 06/20/2014 Document Reviewed: 06/20/2014 Elsevier Interactive Patient Education [employer]2016 Elsevier Inc.

## 2015-09-15 ENCOUNTER — Other Ambulatory Visit: Payer: Self-pay | Admitting: Obstetrics and Gynecology

## 2015-09-15 DIAGNOSIS — Z0489 Encounter for examination and observation for other specified reasons: Secondary | ICD-10-CM

## 2015-09-15 DIAGNOSIS — IMO0002 Reserved for concepts with insufficient information to code with codable children: Secondary | ICD-10-CM

## 2015-09-16 ENCOUNTER — Ambulatory Visit (INDEPENDENT_AMBULATORY_CARE_PROVIDER_SITE_OTHER): Payer: BLUE CROSS/BLUE SHIELD

## 2015-09-16 DIAGNOSIS — Z36 Encounter for antenatal screening of mother: Secondary | ICD-10-CM | POA: Diagnosis not present

## 2015-09-16 DIAGNOSIS — Z0489 Encounter for examination and observation for other specified reasons: Secondary | ICD-10-CM

## 2015-09-16 DIAGNOSIS — IMO0002 Reserved for concepts with insufficient information to code with codable children: Secondary | ICD-10-CM

## 2015-09-20 NOTE — L&D Delivery Note (Signed)
[facility]  Labor Events:   Preterm labor:   Rupture date:   Rupture time:   Rupture type: Artificial  Fluid Color: Clear  Induction:   Augmentation:   Complications:   Cervical ripening:          Delivery:   Episiotomy:   Lacerations:   Repair suture:   Repair # of packets:   Blood loss (ml):    Information for the patient's newborn:  Roland RackWatlington, Boy Lavine [[id number]][[id number]]    Delivery 01/23/2016 11:40 PM by  Vaginal, Forceps Sex:  female Gestational Age: 7528w1d Delivery Clinician:  Daphine DeutscherMartin A Johni Narine Living?: Yes        APGARS  One minute Five minutes Ten minutes  Skin color: 0   1      Heart rate: 2   2      Grimace: 2   2      Muscle tone: 2   2      Breathing: 2   2      Totals: 8  9      Presentation/position: Vertex  Right Occiput Anterior Resuscitation: None  Cord information:    Disposition of cord blood:     Blood gases sent?  Complications:   Placenta: Delivered: 01/24/2016 12:15 AM  Manual removal  Intact appearance Newborn Measurements: Weight:    Height:    Head circumference:    Chest circumference:    Other providers: Delivery Nurse Transition RN Neonatal Nurse Practitioner Registered Nurse Heloise OchoaJenna F Suggs Donna B Grubbs Peggy A Mccracken Lissa HoardAlexis D Evans  Additional  information: Forceps: Low  Vacuum:   Breech:   Observed anomalies        Operative Delivery Note At 11:40 PM a viable female was delivered via Vaginal, Forceps.  Presentation: vertex; Position: Right,, Occiput,, Anterior; Station: +3. (Following Manual rotation from LOP to ROA)  Verbal consent: obtained from patient.  Risks and benefits discussed in detail.  Risks include, but are not limited to the risks of anesthesia, bleeding, infection, damage to maternal tissues, fetal cephalhematoma.  There is also the risk of inability to effect vaginal delivery of the head, or shoulder dystocia that cannot be resolved by established maneuvers, leading to the need for  emergency cesarean section.  APGAR: 8, 9; weight  .   Placenta status: Intact, Manual removal.   Cord:  with the following complications: .  Cord pH: NA  Anesthesia: Epidural  Instruments: Simpson with Bill's Axis Traction Episiotomy: None Lacerations: 2nd degree Suture Repair: 2.0 chromic Est. Blood Loss (mL): 400   Mom to remain x 24 hrs for Magnesium Sulfate prophylaxis.  Baby to Couplet care / Skin to Skin.  Daphine DeutscherMartin A Sherlonda Flater 01/24/2016, 12:30 AM

## 2015-09-30 ENCOUNTER — Encounter: Payer: BLUE CROSS/BLUE SHIELD | Admitting: Certified Nurse Midwife

## 2015-10-01 ENCOUNTER — Ambulatory Visit (INDEPENDENT_AMBULATORY_CARE_PROVIDER_SITE_OTHER): Payer: BLUE CROSS/BLUE SHIELD | Admitting: Obstetrics and Gynecology

## 2015-10-01 VITALS — BP 139/84 | HR 71 | Wt 306.5 lb

## 2015-10-01 DIAGNOSIS — E669 Obesity, unspecified: Secondary | ICD-10-CM

## 2015-10-01 DIAGNOSIS — Z3402 Encounter for supervision of normal first pregnancy, second trimester: Secondary | ICD-10-CM

## 2015-10-01 DIAGNOSIS — O99012 Anemia complicating pregnancy, second trimester: Secondary | ICD-10-CM

## 2015-10-01 DIAGNOSIS — Z3482 Encounter for supervision of other normal pregnancy, second trimester: Secondary | ICD-10-CM

## 2015-10-01 DIAGNOSIS — Z3492 Encounter for supervision of normal pregnancy, unspecified, second trimester: Secondary | ICD-10-CM

## 2015-10-01 DIAGNOSIS — O99212 Obesity complicating pregnancy, second trimester: Secondary | ICD-10-CM

## 2015-10-01 LAB — POCT URINALYSIS DIPSTICK
Bilirubin, UA: NEGATIVE
Blood, UA: NEGATIVE
Glucose, UA: NEGATIVE
KETONES UA: NEGATIVE
LEUKOCYTES UA: NEGATIVE
Nitrite, UA: NEGATIVE
PH UA: 7.5
PROTEIN UA: NEGATIVE
SPEC GRAV UA: 1.01
UROBILINOGEN UA: NEGATIVE

## 2015-10-01 NOTE — Progress Notes (Signed)
ROB: Doing well, no complaints.  Is slowly approaching pre-pregnancy weight. Notes compliance with iron therapy. For 1 hr glucola to be repeated at that time.

## 2015-10-01 NOTE — Addendum Note (Signed)
Addended by: Fabian NovemberHERRY, Ramie Palladino S on: 10/01/2015 11:56 AM   Modules accepted: Orders

## 2015-10-08 ENCOUNTER — Ambulatory Visit (INDEPENDENT_AMBULATORY_CARE_PROVIDER_SITE_OTHER): Payer: BLUE CROSS/BLUE SHIELD | Admitting: Obstetrics and Gynecology

## 2015-10-08 ENCOUNTER — Encounter: Payer: Self-pay | Admitting: Obstetrics and Gynecology

## 2015-10-08 VITALS — BP 128/70 | HR 87 | Wt 303.6 lb

## 2015-10-08 DIAGNOSIS — Z331 Pregnant state, incidental: Secondary | ICD-10-CM

## 2015-10-08 LAB — POCT URINALYSIS DIPSTICK
Bilirubin, UA: NEGATIVE
Blood, UA: NEGATIVE
Glucose, UA: NEGATIVE
Ketones, UA: NEGATIVE
NITRITE UA: NEGATIVE
PH UA: 7
Spec Grav, UA: <=1.005
UROBILINOGEN UA: 0.2
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
## 2015-10-29 ENCOUNTER — Other Ambulatory Visit: Payer: BLUE CROSS/BLUE SHIELD

## 2015-10-29 ENCOUNTER — Other Ambulatory Visit: Payer: Self-pay | Admitting: *Deleted

## 2015-10-29 DIAGNOSIS — O99012 Anemia complicating pregnancy, second trimester: Secondary | ICD-10-CM

## 2015-10-29 DIAGNOSIS — Z131 Encounter for screening for diabetes mellitus: Secondary | ICD-10-CM

## 2015-10-29 DIAGNOSIS — O99212 Obesity complicating pregnancy, second trimester: Secondary | ICD-10-CM

## 2015-10-29 DIAGNOSIS — Z331 Pregnant state, incidental: Secondary | ICD-10-CM

## 2015-10-29 DIAGNOSIS — Z3402 Encounter for supervision of normal first pregnancy, second trimester: Secondary | ICD-10-CM

## 2015-10-30 ENCOUNTER — Ambulatory Visit (INDEPENDENT_AMBULATORY_CARE_PROVIDER_SITE_OTHER): Payer: BLUE CROSS/BLUE SHIELD | Admitting: Obstetrics and Gynecology

## 2015-10-30 ENCOUNTER — Encounter: Payer: Self-pay | Admitting: Obstetrics and Gynecology

## 2015-10-30 ENCOUNTER — Other Ambulatory Visit: Payer: BLUE CROSS/BLUE SHIELD

## 2015-10-30 VITALS — BP 120/71 | HR 95 | Wt 306.6 lb

## 2015-10-30 DIAGNOSIS — Z23 Encounter for immunization: Secondary | ICD-10-CM

## 2015-10-30 DIAGNOSIS — Z331 Pregnant state, incidental: Secondary | ICD-10-CM

## 2015-10-30 LAB — POCT URINALYSIS DIPSTICK
Bilirubin, UA: NEGATIVE
Blood, UA: NEGATIVE
GLUCOSE UA: NEGATIVE
Ketones, UA: NEGATIVE
Nitrite, UA: NEGATIVE
Protein, UA: NEGATIVE
SPEC GRAV UA: 1.01
UROBILINOGEN UA: 0.2
pH, UA: 6.5

## 2015-10-30 MED ORDER — TETANUS-DIPHTH-ACELL PERTUSSIS 5-2.5-18.5 LF-MCG/0.5 IM SUSP
0.5000 mL | Freq: Once | INTRAMUSCULAR | Status: AC
  Administered 2015-10-30: 0.5 mL via INTRAMUSCULAR

## 2015-10-30 NOTE — Progress Notes (Signed)
ROB-info on CBC, ICC, BFC and cord blood donation

## 2015-10-30 NOTE — Progress Notes (Signed)
ROB-blood consent signed, glucola done, tdap given

## 2015-10-30 NOTE — Patient Instructions (Signed)

## 2015-10-31 LAB — GLUCOSE, 1 HOUR GESTATIONAL: Gestational Diabetes Screen: 114 mg/dL (ref 65–139)

## 2015-10-31 LAB — HEMOGLOBIN AND HEMATOCRIT, BLOOD
Hematocrit: 28.7 % — ABNORMAL LOW (ref 34.0–46.6)
Hemoglobin: 9.8 g/dL — ABNORMAL LOW (ref 11.1–15.9)

## 2015-11-03 ENCOUNTER — Telehealth: Payer: Self-pay | Admitting: *Deleted

## 2015-11-03 NOTE — Telephone Encounter (Signed)
-----   Message from Hildred Laser, MD sent at 11/02/2015  1:09 PM EST ----- Please inform patient that 1 hr glucola was normal.  Is anemic, needs to begin taking an additional iron supplement daily.

## 2015-11-03 NOTE — Telephone Encounter (Signed)
Notified pt of results 

## 2015-11-13 ENCOUNTER — Ambulatory Visit (INDEPENDENT_AMBULATORY_CARE_PROVIDER_SITE_OTHER): Payer: BLUE CROSS/BLUE SHIELD | Admitting: Obstetrics and Gynecology

## 2015-11-13 ENCOUNTER — Encounter: Payer: Self-pay | Admitting: Obstetrics and Gynecology

## 2015-11-13 VITALS — BP 135/74 | HR 93 | Wt 306.9 lb

## 2015-11-13 DIAGNOSIS — Z331 Pregnant state, incidental: Secondary | ICD-10-CM

## 2015-11-13 LAB — POCT URINALYSIS DIPSTICK
BILIRUBIN UA: NEGATIVE
GLUCOSE UA: NEGATIVE
KETONES UA: NEGATIVE
Nitrite, UA: NEGATIVE
PH UA: 6.5
Protein, UA: NEGATIVE
RBC UA: NEGATIVE
Spec Grav, UA: 1.01
Urobilinogen, UA: 0.2

## 2015-11-13 NOTE — Progress Notes (Signed)
ROB- doing well, discussed traveling in third trimester.

## 2015-11-13 NOTE — Progress Notes (Signed)
ROB-no complaints at this time

## 2015-11-26 ENCOUNTER — Ambulatory Visit (INDEPENDENT_AMBULATORY_CARE_PROVIDER_SITE_OTHER): Payer: BLUE CROSS/BLUE SHIELD | Admitting: Obstetrics and Gynecology

## 2015-11-26 ENCOUNTER — Encounter: Payer: Self-pay | Admitting: Obstetrics and Gynecology

## 2015-11-26 VITALS — BP 130/76 | HR 104 | Wt 307.8 lb

## 2015-11-26 DIAGNOSIS — Z3483 Encounter for supervision of other normal pregnancy, third trimester: Secondary | ICD-10-CM

## 2015-11-26 DIAGNOSIS — Z331 Pregnant state, incidental: Secondary | ICD-10-CM

## 2015-11-26 LAB — POCT URINALYSIS DIPSTICK
BILIRUBIN UA: NEGATIVE
GLUCOSE UA: NEGATIVE
Ketones, UA: NEGATIVE
Leukocytes, UA: NEGATIVE
NITRITE UA: NEGATIVE
Protein, UA: NEGATIVE
RBC UA: NEGATIVE
Spec Grav, UA: 1.01
UROBILINOGEN UA: 0.2
pH, UA: 7

## 2015-11-26 NOTE — Progress Notes (Signed)
ROB-pt denies any complaints 

## 2015-11-26 NOTE — Progress Notes (Signed)
ROB-doing good; starts childbirth classes this week

## 2015-12-11 ENCOUNTER — Encounter: Payer: Self-pay | Admitting: Obstetrics and Gynecology

## 2015-12-11 ENCOUNTER — Ambulatory Visit (INDEPENDENT_AMBULATORY_CARE_PROVIDER_SITE_OTHER): Payer: BLUE CROSS/BLUE SHIELD | Admitting: Obstetrics and Gynecology

## 2015-12-11 VITALS — BP 136/80 | HR 104 | Wt 310.3 lb

## 2015-12-11 DIAGNOSIS — J019 Acute sinusitis, unspecified: Secondary | ICD-10-CM

## 2015-12-11 DIAGNOSIS — Z331 Pregnant state, incidental: Secondary | ICD-10-CM

## 2015-12-11 LAB — POCT URINALYSIS DIPSTICK
Bilirubin, UA: NEGATIVE
Glucose, UA: NEGATIVE
KETONES UA: NEGATIVE
Leukocytes, UA: NEGATIVE
Nitrite, UA: NEGATIVE
PH UA: 6.5
PROTEIN UA: NEGATIVE
RBC UA: NEGATIVE
UROBILINOGEN UA: 0.2

## 2015-12-11 MED ORDER — CEFDINIR 300 MG PO CAPS: 300.0000 mg | ORAL_CAPSULE | Freq: Two times a day (BID) | ORAL | Status: DC

## 2015-12-11 NOTE — Progress Notes (Signed)
ROB- doing well except sinus pressure with yellow drainage x 2 days, fever 99.3-100.5, sore throat and dry cough. Nasal passages red and boggy with green mucus noted, throat clear and lungs clear. To continue sudafed and tylenol prn, add saline nasal spray and Omnicef sent in. Note to excuse from work.

## 2015-12-11 NOTE — Progress Notes (Signed)
ROB- nasal congestion, sore throat, cough- non productive x 2 ,Pt is having low back pain this week

## 2015-12-16 ENCOUNTER — Telehealth: Payer: Self-pay | Admitting: Obstetrics and Gynecology

## 2015-12-16 ENCOUNTER — Other Ambulatory Visit: Payer: Self-pay | Admitting: Obstetrics and Gynecology

## 2015-12-16 MED ORDER — FLUCONAZOLE 150 MG PO TABS: 150.0000 mg | ORAL_TABLET | Freq: Once | ORAL | Status: DC

## 2015-12-16 NOTE — Telephone Encounter (Signed)
Done-ac 

## 2015-12-16 NOTE — Telephone Encounter (Signed)
PT CALLED AND SHE WAS GIVEN THE ANTIBIOTIC LAST Friday FOR SINUS INFECTION AND SHE IS HAVING THE BEGINNING THE STAGES OF A YEAST INFECTION, SO SHE WANTED TO KNOW IF YOU COULD CALL HER IN SOMETHING FOR THAT. SHE USES [facility] [address].

## 2015-12-16 NOTE — Telephone Encounter (Signed)
Please let her know I sent in a prescription for diflucan. 

## 2015-12-16 NOTE — Telephone Encounter (Signed)
pls advise

## 2015-12-25 ENCOUNTER — Ambulatory Visit (INDEPENDENT_AMBULATORY_CARE_PROVIDER_SITE_OTHER): Payer: BLUE CROSS/BLUE SHIELD | Admitting: Obstetrics and Gynecology

## 2015-12-25 ENCOUNTER — Other Ambulatory Visit: Payer: Self-pay | Admitting: Obstetrics and Gynecology

## 2015-12-25 ENCOUNTER — Encounter: Payer: Self-pay | Admitting: Obstetrics and Gynecology

## 2015-12-25 VITALS — BP 137/84 | HR 87 | Wt 314.8 lb

## 2015-12-25 DIAGNOSIS — Z331 Pregnant state, incidental: Secondary | ICD-10-CM

## 2015-12-25 LAB — POCT URINALYSIS DIPSTICK
Bilirubin, UA: NEGATIVE
GLUCOSE UA: NEGATIVE
Ketones, UA: NEGATIVE
NITRITE UA: NEGATIVE
PH UA: 7
Protein, UA: NEGATIVE
RBC UA: NEGATIVE
Spec Grav, UA: 1.01
UROBILINOGEN UA: 0.2

## 2015-12-25 NOTE — Progress Notes (Signed)
ROB-doing better, cultures next visit

## 2015-12-25 NOTE — Progress Notes (Signed)
ROB- pt is doing well 

## 2015-12-26 LAB — URINE CULTURE

## 2015-12-31 ENCOUNTER — Encounter: Payer: Self-pay | Admitting: Obstetrics and Gynecology

## 2015-12-31 ENCOUNTER — Ambulatory Visit (INDEPENDENT_AMBULATORY_CARE_PROVIDER_SITE_OTHER): Payer: BLUE CROSS/BLUE SHIELD | Admitting: Obstetrics and Gynecology

## 2015-12-31 VITALS — BP 148/85 | HR 93 | Wt 317.2 lb

## 2015-12-31 DIAGNOSIS — Z3685 Encounter for antenatal screening for Streptococcus B: Secondary | ICD-10-CM

## 2015-12-31 DIAGNOSIS — Z331 Pregnant state, incidental: Secondary | ICD-10-CM

## 2015-12-31 DIAGNOSIS — Z113 Encounter for screening for infections with a predominantly sexual mode of transmission: Secondary | ICD-10-CM

## 2015-12-31 DIAGNOSIS — Z36 Encounter for antenatal screening of mother: Secondary | ICD-10-CM

## 2015-12-31 LAB — POCT URINALYSIS DIPSTICK
BILIRUBIN UA: NEGATIVE
GLUCOSE UA: NEGATIVE
KETONES UA: NEGATIVE
NITRITE UA: NEGATIVE
PH UA: 7
Protein, UA: NEGATIVE
RBC UA: NEGATIVE
Spec Grav, UA: <=1.005
Urobilinogen, UA: 0.2

## 2015-12-31 NOTE — Progress Notes (Signed)
ROB- cultures obtained, B feet swelling, low back pain, some pelvic pressure

## 2015-12-31 NOTE — Patient Instructions (Signed)
Braxton Hicks Contractions °Contractions of the uterus can occur throughout pregnancy. Contractions are not always a sign that you are in labor.  °WHAT ARE BRAXTON HICKS CONTRACTIONS?  °Contractions that occur before labor are called Braxton Hicks contractions, or false labor. Toward the end of pregnancy (32-34 weeks), these contractions can develop more often and may become more forceful. This is not true labor because these contractions do not result in opening (dilatation) and thinning of the cervix. They are sometimes difficult to tell apart from true labor because these contractions can be forceful and people have different pain tolerances. You should not feel embarrassed if you go to the hospital with false labor. Sometimes, the only way to tell if you are in true labor is for your health care provider to look for changes in the cervix. °If there are no prenatal problems or other health problems associated with the pregnancy, it is completely safe to be sent home with false labor and await the onset of true labor. °HOW CAN YOU TELL THE DIFFERENCE BETWEEN TRUE AND FALSE LABOR? °False Labor °· The contractions of false labor are usually shorter and not as hard as those of true labor.   °· The contractions are usually irregular.   °· The contractions are often felt in the front of the lower abdomen and in the groin.   °· The contractions may go away when you walk around or change positions while lying down.   °· The contractions get weaker and are shorter lasting as time goes on.   °· The contractions do not usually become progressively stronger, regular, and closer together as with true labor.   °True Labor °· Contractions in true labor last 30-70 seconds, become very regular, usually become more intense, and increase in frequency.   °· The contractions do not go away with walking.   °· The discomfort is usually felt in the top of the uterus and spreads to the lower abdomen and low back.   °· True labor can be  determined by your health care provider with an exam. This will show that the cervix is dilating and getting thinner.   °WHAT TO REMEMBER °· Keep up with your usual exercises and follow other instructions given by your health care provider.   °· Take medicines as directed by your health care provider.   °· Keep your regular prenatal appointments.   °· Eat and drink lightly if you think you are going into labor.   °· If Braxton Hicks contractions are making you uncomfortable:   °¨ Change your position from lying down or resting to walking, or from walking to resting.   °¨ Sit and rest in a tub of warm water.   °¨ Drink 2-3 glasses of water. Dehydration may cause these contractions.   °¨ Do slow and deep breathing several times an hour.   °WHEN SHOULD I SEEK IMMEDIATE MEDICAL CARE? °Seek immediate medical care if: °· Your contractions become stronger, more regular, and closer together.   °· You have fluid leaking or gushing from your vagina.   °· You have a fever.   °· You pass blood-tinged mucus.   °· You have vaginal bleeding.   °· You have continuous abdominal pain.   °· You have low back pain that you never had before.   °· You feel your baby's head pushing down and causing pelvic pressure.   °· Your baby is not moving as much as it used to.   °  °This information is not intended to replace advice given to you by your health care provider. Make sure you discuss any questions you have with your health care   provider. °  °Document Released: 09/05/2005 Document Revised: 09/10/2013 Document Reviewed: 06/17/2013 °Elsevier Interactive Patient Education ©2016 Elsevier Inc. ° °

## 2015-12-31 NOTE — Progress Notes (Signed)
ROB-doing well, BHC discussed, cultures obtained

## 2016-01-02 LAB — STREP GP B NAA: Strep Gp B NAA: NEGATIVE

## 2016-01-02 LAB — GC/CHLAMYDIA PROBE AMP
CHLAMYDIA, DNA PROBE: NEGATIVE
NEISSERIA GONORRHOEAE BY PCR: NEGATIVE

## 2016-01-08 ENCOUNTER — Ambulatory Visit (INDEPENDENT_AMBULATORY_CARE_PROVIDER_SITE_OTHER): Payer: BLUE CROSS/BLUE SHIELD | Admitting: Obstetrics and Gynecology

## 2016-01-08 ENCOUNTER — Ambulatory Visit (INDEPENDENT_AMBULATORY_CARE_PROVIDER_SITE_OTHER): Payer: BLUE CROSS/BLUE SHIELD

## 2016-01-08 ENCOUNTER — Other Ambulatory Visit: Payer: Self-pay | Admitting: Obstetrics and Gynecology

## 2016-01-08 ENCOUNTER — Encounter: Payer: Self-pay | Admitting: Obstetrics and Gynecology

## 2016-01-08 VITALS — BP 140/87 | HR 91 | Wt 323.7 lb

## 2016-01-08 DIAGNOSIS — O26849 Uterine size-date discrepancy, unspecified trimester: Secondary | ICD-10-CM

## 2016-01-08 DIAGNOSIS — Z331 Pregnant state, incidental: Secondary | ICD-10-CM

## 2016-01-08 LAB — POCT URINALYSIS DIPSTICK
Bilirubin, UA: NEGATIVE
Blood, UA: NEGATIVE
GLUCOSE UA: NEGATIVE
Ketones, UA: NEGATIVE
NITRITE UA: NEGATIVE
Protein, UA: NEGATIVE
Spec Grav, UA: 1.01
UROBILINOGEN UA: 0.2
pH, UA: 6.5

## 2016-01-08 NOTE — Progress Notes (Signed)
ROB-denies any changes; Size>dates and unsure presentaton as FHR obtained in RUQ-u/s done today, reveals:

## 2016-01-08 NOTE — Progress Notes (Signed)
ROB-pt c/o low back pain, some pelvic pressure

## 2016-01-15 ENCOUNTER — Encounter: Payer: Self-pay | Admitting: Obstetrics and Gynecology

## 2016-01-15 ENCOUNTER — Ambulatory Visit (INDEPENDENT_AMBULATORY_CARE_PROVIDER_SITE_OTHER): Payer: BLUE CROSS/BLUE SHIELD | Admitting: Obstetrics and Gynecology

## 2016-01-15 VITALS — BP 146/87 | HR 93 | Wt 329.1 lb

## 2016-01-15 DIAGNOSIS — Z331 Pregnant state, incidental: Secondary | ICD-10-CM

## 2016-01-15 LAB — POCT URINALYSIS DIPSTICK
BILIRUBIN UA: NEGATIVE
GLUCOSE UA: NEGATIVE
Ketones, UA: NEGATIVE
Nitrite, UA: NEGATIVE
RBC UA: NEGATIVE
SPEC GRAV UA: 1.01
UROBILINOGEN UA: 0.2
pH, UA: 6

## 2016-01-15 NOTE — Progress Notes (Signed)
ROB- denies any changes since last visit.

## 2016-01-15 NOTE — Progress Notes (Signed)
ROB- pt is c/o pelvic pressure, low back pain 

## 2016-01-18 ENCOUNTER — Other Ambulatory Visit: Payer: Self-pay | Admitting: Obstetrics and Gynecology

## 2016-01-20 ENCOUNTER — Other Ambulatory Visit: Payer: Self-pay | Admitting: Obstetrics and Gynecology

## 2016-01-20 LAB — URINE CULTURE

## 2016-01-20 MED ORDER — FLUCONAZOLE 150 MG PO TABS: 150.0000 mg | ORAL_TABLET | Freq: Once | ORAL | Status: DC

## 2016-01-20 MED ORDER — NITROFURANTOIN MONOHYD MACRO 100 MG PO CAPS: 100.0000 mg | ORAL_CAPSULE | Freq: Two times a day (BID) | ORAL | Status: DC

## 2016-01-21 ENCOUNTER — Ambulatory Visit (INDEPENDENT_AMBULATORY_CARE_PROVIDER_SITE_OTHER): Payer: BLUE CROSS/BLUE SHIELD | Admitting: Obstetrics and Gynecology

## 2016-01-21 ENCOUNTER — Encounter: Payer: Self-pay | Admitting: *Deleted

## 2016-01-21 ENCOUNTER — Inpatient Hospital Stay
Admission: EM | Admit: 2016-01-21 | Discharge: 2016-01-25 | DRG: 775 | Disposition: A | Discharge status: Home / Self Care | Payer: BLUE CROSS/BLUE SHIELD | Attending: Obstetrics and Gynecology | Admitting: Obstetrics and Gynecology

## 2016-01-21 VITALS — BP 166/104 | HR 81 | Wt 336.2 lb

## 2016-01-21 DIAGNOSIS — O134 Gestational [pregnancy-induced] hypertension without significant proteinuria, complicating childbirth: Secondary | ICD-10-CM | POA: Diagnosis present

## 2016-01-21 DIAGNOSIS — O99212 Obesity complicating pregnancy, second trimester: Secondary | ICD-10-CM

## 2016-01-21 DIAGNOSIS — Z3A38 38 weeks gestation of pregnancy: Secondary | ICD-10-CM

## 2016-01-21 DIAGNOSIS — O99012 Anemia complicating pregnancy, second trimester: Secondary | ICD-10-CM

## 2016-01-21 DIAGNOSIS — Z3483 Encounter for supervision of other normal pregnancy, third trimester: Secondary | ICD-10-CM

## 2016-01-21 DIAGNOSIS — Z8249 Family history of ischemic heart disease and other diseases of the circulatory system: Secondary | ICD-10-CM

## 2016-01-21 DIAGNOSIS — O99824 Streptococcus B carrier state complicating childbirth: Secondary | ICD-10-CM | POA: Diagnosis present

## 2016-01-21 DIAGNOSIS — O9902 Anemia complicating childbirth: Secondary | ICD-10-CM | POA: Diagnosis present

## 2016-01-21 DIAGNOSIS — Z79899 Other long term (current) drug therapy: Secondary | ICD-10-CM | POA: Diagnosis not present

## 2016-01-21 DIAGNOSIS — Z3403 Encounter for supervision of normal first pregnancy, third trimester: Secondary | ICD-10-CM | POA: Diagnosis not present

## 2016-01-21 DIAGNOSIS — Z6841 Body Mass Index (BMI) 40.0 and over, adult: Secondary | ICD-10-CM | POA: Diagnosis not present

## 2016-01-21 DIAGNOSIS — O1404 Mild to moderate pre-eclampsia, complicating childbirth: Secondary | ICD-10-CM | POA: Diagnosis present

## 2016-01-21 DIAGNOSIS — O99214 Obesity complicating childbirth: Secondary | ICD-10-CM | POA: Diagnosis present

## 2016-01-21 DIAGNOSIS — O1494 Unspecified pre-eclampsia, complicating childbirth: Secondary | ICD-10-CM

## 2016-01-21 DIAGNOSIS — Z23 Encounter for immunization: Secondary | ICD-10-CM

## 2016-01-21 DIAGNOSIS — O133 Gestational [pregnancy-induced] hypertension without significant proteinuria, third trimester: Secondary | ICD-10-CM | POA: Diagnosis present

## 2016-01-21 DIAGNOSIS — O1400 Mild to moderate pre-eclampsia, unspecified trimester: Secondary | ICD-10-CM | POA: Diagnosis present

## 2016-01-21 DIAGNOSIS — O99213 Obesity complicating pregnancy, third trimester: Secondary | ICD-10-CM

## 2016-01-21 DIAGNOSIS — Z3689 Encounter for other specified antenatal screening: Secondary | ICD-10-CM

## 2016-01-21 DIAGNOSIS — Z3493 Encounter for supervision of normal pregnancy, unspecified, third trimester: Secondary | ICD-10-CM

## 2016-01-21 DIAGNOSIS — E669 Obesity, unspecified: Secondary | ICD-10-CM

## 2016-01-21 LAB — POCT URINALYSIS DIPSTICK
Bilirubin, UA: NEGATIVE
Glucose, UA: NEGATIVE
KETONES UA: NEGATIVE
NITRITE UA: NEGATIVE
PH UA: 7.5
Spec Grav, UA: <=1.005
Urobilinogen, UA: NEGATIVE

## 2016-01-21 LAB — CHLAMYDIA/NGC RT PCR (ARMC ONLY)
CHLAMYDIA TR: NOT DETECTED
N GONORRHOEAE: NOT DETECTED

## 2016-01-21 LAB — CBC
HCT: 28.2 % — ABNORMAL LOW (ref 35.0–47.0)
Hemoglobin: 9.6 g/dL — ABNORMAL LOW (ref 12.0–16.0)
MCH: 29 pg (ref 26.0–34.0)
MCHC: 33.9 g/dL (ref 32.0–36.0)
MCV: 85.6 fL (ref 80.0–100.0)
Platelets: 241 10*3/uL (ref 150–440)
RBC: 3.3 MIL/uL — ABNORMAL LOW (ref 3.80–5.20)
RDW: 14.4 % (ref 11.5–14.5)
WBC: 5.8 10*3/uL (ref 3.6–11.0)

## 2016-01-21 LAB — PROTEIN / CREATININE RATIO, URINE

## 2016-01-21 LAB — TYPE AND SCREEN
ABO/RH(D): A POS
ANTIBODY SCREEN: NEGATIVE

## 2016-01-21 LAB — RAPID HIV SCREEN (HIV 1/2 AB+AG)
HIV 1/2 ANTIBODIES: NONREACTIVE
HIV-1 P24 Antigen - HIV24: NONREACTIVE

## 2016-01-21 MED ORDER — TERBUTALINE SULFATE 1 MG/ML IJ SOLN: 0.2500 mg | Freq: Once | INTRAMUSCULAR | Status: DC | PRN

## 2016-01-21 MED ORDER — LACTATED RINGERS IV SOLN
2.0000 g/h | INTRAVENOUS | Status: AC
  Administered 2016-01-21: 2 g/h via INTRAVENOUS
  Filled 2016-01-21 (×2): qty 80

## 2016-01-21 MED ORDER — MISOPROSTOL 25 MCG QUARTER TABLET
ORAL_TABLET | ORAL | Status: AC
Start: 2016-01-21 — End: 2016-01-21
  Administered 2016-01-21: 25 ug via VAGINAL
  Filled 2016-01-21: qty 0.25

## 2016-01-21 MED ORDER — LABETALOL HCL 5 MG/ML IV SOLN
INTRAVENOUS | Status: AC
  Administered 2016-01-21: 20 mg via INTRAVENOUS
  Filled 2016-01-21: qty 4

## 2016-01-21 MED ORDER — AMOXICILLIN 500 MG PO CAPS: 500.0000 mg | ORAL_CAPSULE | Freq: Three times a day (TID) | ORAL | Status: DC

## 2016-01-21 MED ORDER — OXYTOCIN 40 UNITS IN LACTATED RINGERS INFUSION - SIMPLE MED
2.5000 [IU]/h | INTRAVENOUS | Status: DC
  Administered 2016-01-24: 2.5 [IU]/h via INTRAVENOUS
  Filled 2016-01-21: qty 1000

## 2016-01-21 MED ORDER — LACTATED RINGERS IV SOLN: 500.0000 mL | INTRAVENOUS | Status: DC | PRN

## 2016-01-21 MED ORDER — MISOPROSTOL 200 MCG PO TABS
ORAL_TABLET | ORAL | Status: AC
  Administered 2016-01-21: 25 ug via VAGINAL
  Filled 2016-01-21: qty 4

## 2016-01-21 MED ORDER — ACETAMINOPHEN 325 MG PO TABS
650.0000 mg | ORAL_TABLET | ORAL | Status: DC | PRN
  Administered 2016-01-22: 650 mg via ORAL
  Filled 2016-01-21: qty 2

## 2016-01-21 MED ORDER — AMMONIA AROMATIC IN INHA
RESPIRATORY_TRACT | Status: AC
  Filled 2016-01-21: qty 10

## 2016-01-21 MED ORDER — OXYCODONE-ACETAMINOPHEN 5-325 MG PO TABS
2.0000 | ORAL_TABLET | ORAL | Status: DC | PRN
  Filled 2016-01-21: qty 2

## 2016-01-21 MED ORDER — LABETALOL HCL 5 MG/ML IV SOLN
20.0000 mg | INTRAVENOUS | Status: AC | PRN
  Administered 2016-01-21 – 2016-01-22 (×3): 20 mg via INTRAVENOUS

## 2016-01-21 MED ORDER — OXYCODONE-ACETAMINOPHEN 5-325 MG PO TABS: 1.0000 | ORAL_TABLET | ORAL | Status: DC | PRN

## 2016-01-21 MED ORDER — CITRIC ACID-SODIUM CITRATE 334-500 MG/5ML PO SOLN: 30.0000 mL | ORAL | Status: DC | PRN

## 2016-01-21 MED ORDER — MAGNESIUM SULFATE BOLUS VIA INFUSION
4.0000 g | Freq: Once | INTRAVENOUS | Status: AC
  Administered 2016-01-21: 4 g via INTRAVENOUS
  Filled 2016-01-21: qty 500

## 2016-01-21 MED ORDER — SODIUM CHLORIDE 0.9 % IV SOLN
2.0000 g | Freq: Once | INTRAVENOUS | Status: AC
  Administered 2016-01-21: 2 g via INTRAVENOUS
  Filled 2016-01-21: qty 2000

## 2016-01-21 MED ORDER — MISOPROSTOL 25 MCG QUARTER TABLET
25.0000 ug | ORAL_TABLET | ORAL | Status: DC | PRN
  Administered 2016-01-21 – 2016-01-23 (×8): 25 ug via VAGINAL
  Filled 2016-01-21 (×7): qty 0.25
  Filled 2016-01-21: qty 1
  Filled 2016-01-21: qty 0.25

## 2016-01-21 MED ORDER — LACTATED RINGERS IV SOLN
INTRAVENOUS | Status: DC
  Administered 2016-01-21 – 2016-01-22 (×3): via INTRAVENOUS
  Administered 2016-01-23: 100 mL/h via INTRAVENOUS
  Administered 2016-01-23: 06:00:00 via INTRAVENOUS

## 2016-01-21 MED ORDER — SODIUM CHLORIDE 0.9 % IV SOLN
1.0000 g | INTRAVENOUS | Status: DC
  Administered 2016-01-21 – 2016-01-23 (×6): 1 g via INTRAVENOUS
  Filled 2016-01-21 (×6): qty 1000

## 2016-01-21 MED ORDER — ONDANSETRON HCL 4 MG/2ML IJ SOLN
4.0000 mg | Freq: Four times a day (QID) | INTRAMUSCULAR | Status: DC | PRN
  Administered 2016-01-23: 4 mg via INTRAVENOUS
  Filled 2016-01-21: qty 2

## 2016-01-21 MED ORDER — BUTORPHANOL TARTRATE 1 MG/ML IJ SOLN: 1.0000 mg | INTRAMUSCULAR | Status: DC | PRN

## 2016-01-21 MED ORDER — HYDRALAZINE HCL 20 MG/ML IJ SOLN: 10.0000 mg | Freq: Once | INTRAMUSCULAR | Status: DC | PRN

## 2016-01-21 MED ORDER — LIDOCAINE HCL (PF) 1 % IJ SOLN
30.0000 mL | INTRAMUSCULAR | Status: DC | PRN
  Filled 2016-01-21: qty 30

## 2016-01-21 MED ORDER — OXYTOCIN BOLUS FROM INFUSION
500.0000 mL | INTRAVENOUS | Status: DC
  Administered 2016-01-24: 500 mL via INTRAVENOUS

## 2016-01-21 MED ORDER — OXYTOCIN 40 UNITS IN LACTATED RINGERS INFUSION - SIMPLE MED
1.0000 m[IU]/min | INTRAVENOUS | Status: DC
  Administered 2016-01-22 – 2016-01-23 (×2): 2 m[IU]/min via INTRAVENOUS
  Filled 2016-01-21: qty 1000

## 2016-01-21 NOTE — OB Triage Note (Signed)
[facility] due to increased BP in the office (166/104). Pt. Denies visual changes, epigastric pain, or headache. 2+ pitting pedal edema, no clonus.

## 2016-01-21 NOTE — Progress Notes (Signed)
Pt. Consumed a regular diet, cytotec 25 mcg given via vaginally placed.  Ampicillin 2 g given IVPB. RN will continue to monitor.

## 2016-01-21 NOTE — Progress Notes (Signed)
ROB: Patient denies complaints.  Patient with GBS urine Cx noted on 5/1.  Will treat with amoxicillin.  Will need GBS prophylaxis in labor. Elevated BPs noted. Currently with GHTN.  Will send PIH labs, send to L&D for further evaluation.

## 2016-01-21 NOTE — H&P (Signed)
Obstetric History and Physical  Victoria Gibbs is a 25 y.o. G1P0 with IUP at 4577w6d presenting for IOL for mild pre-eclampsia. BPs in office 140s-160s-80s.  Was sent to Labor and Delivery for further eval, BPS remained 140s-150s/80s-90s with protienuria noted (PC ratio 783).  office Patient states she has been having occasional mild contractions, none vaginal bleeding, intact membranes, with active fetal movement.    Prenatal Course Source of Care: [facility] with onset of care at 12 weeks Pregnancy complications or risks: Patient Active Problem List   Diagnosis Date Noted  . Gestational hypertension without significant proteinuria in third trimester 01/21/2016  . Mild pre-eclampsia 01/21/2016  . Supervision of normal pregnancy in third trimester 01/21/2016  . Anemia affecting pregnancy in third trimester 01/21/2015  . Obesity affecting pregnancy in third trimester 09/02/2015  . Encounter for fetal anatomic survey 09/02/2015  . Morbid obesity (HCC) 02/27/2013  . Headache(784.0) 02/27/2013   She plans to breastfeed She desires undecided for postpartum contraception.   Prenatal labs and studies: ABO, Rh: --/--/A POS (05/04 1405) Antibody: NEG (05/04 1405) Rubella: 5.18 (09/27 0959) RPR: Non Reactive (09/27 0959)  HBsAg: Negative (09/27 0959)  HIV: Non Reactive (09/27 0959)  ZOX:WRUEAVWUGBS:Negative (04/13 1522) 1 hr Glucola  Normal (73) Genetic screening normal Anatomy US normal   Past Medical History  Diagnosis Date  . Obesity     History reviewed. No pertinent past surgical history.  OB History  Gravida Para Term Preterm AB SAB TAB Ectopic Multiple Living  1             # Outcome Date GA Lbr Len/2nd Weight Sex Delivery Anes PTL Lv  1 Current               Social History   Social History  . Marital Status: Single    Spouse Name: N/A  . Number of Children: N/A  . Years of Education: N/A   Social History Main Topics  . Smoking status: Never Smoker   .  Smokeless tobacco: Never Used  . Alcohol Use: No     Comment: occasional  . Drug Use: No  . Sexual Activity: No   Other Topics Concern  . None   Social History Narrative    Family History  Problem Relation Age of Onset  . Hypertension Mother   . Diabetes Brother     Prescriptions prior to admission  Medication Sig Dispense Refill Last Dose  . ferrous fumarate (HEMOCYTE - 106 MG FE) 325 (106 Fe) MG TABS tablet Take 1 tablet by mouth.   01/20/2016 at Unknown time  . Prenatal Vit-Fe Fumarate-FA (PRENATAL MULTIVITAMIN) TABS tablet Take 1 tablet by mouth daily at 12 noon.   01/21/2016 at Unknown time  . amoxicillin (AMOXIL) 500 MG capsule Take 1 capsule (500 mg total) by mouth 3 (three) times daily. (Patient not taking: Reported on 01/21/2016) 21 capsule 0 Not Taking at Unknown time    No Known Allergies  Review of Systems: Negative except for what is mentioned in HPI.  Physical Exam: BP 135/81 mmHg  Pulse 79  Temp(Src) 99 F (37.2 C) (Oral)  Resp 18  Ht 5\' 9"  (1.753 m)  Wt 336 lb (152.409 kg)  BMI 49.60 kg/m2  LMP 04/24/2015 CONSTITUTIONAL: Well-developed, well-nourished female in no acute distress.  Morbidly obese.  HENT:  Normocephalic, atraumatic, External right and left ear normal. Oropharynx is clear and moist EYES: Conjunctivae and EOM are normal. Pupils are equal, round, and reactive to light.  No scleral icterus.  NECK: Normal range of motion, supple, no masses SKIN: Skin is warm and dry. No rash noted. Not diaphoretic. No erythema. No pallor. NEUROLOGIC: Alert and oriented to person, place, and time. Normal reflexes, muscle tone coordination. No cranial nerve deficit noted. PSYCHIATRIC: Normal mood and affect. Normal behavior. Normal judgment and thought content. CARDIOVASCULAR: Normal heart rate noted, regular rhythm RESPIRATORY: Effort and breath sounds normal, no problems with respiration noted ABDOMEN: Soft, nontender, nondistended, gravid. MUSCULOSKELETAL: Normal  range of motion. No edema and no tenderness. 2+ distal pulses.  Cervical Exam: Dilatation 0cm   Effacement 50%   Station -3   Presentation: cephalic FHT:  Baseline rate 145 bpm   Variability moderate  Accelerations present   Decelerations none Contractions: Irritability   Pertinent Labs/Studies:   Results for orders placed or performed during the hospital encounter of 01/21/16 (from the past 24 hour(s))   Protein Creatinine Ratio        0.00 - 0.15 mg/mg[Cre]  Chlamydia/NGC rt PCR ([facility] only)     Status: None   Collection Time: 01/21/16  1:05 PM  Result Value Ref Range   Specimen source GC/Chlam URINE, RANDOM    Chlamydia Tr NOT DETECTED NOT DETECTED   N gonorrhoeae NOT DETECTED NOT DETECTED  Type and screen [facility]     Status: None   Collection Time: 01/21/16  2:05 PM  Result Value Ref Range   ABO/RH(D) A POS    Antibody Screen NEG    Sample Expiration 01/24/2016   CBC     Status: Abnormal   Collection Time: 01/21/16  2:07 PM  Result Value Ref Range   WBC 5.8 3.6 - 11.0 K/uL   RBC 3.30 (L) 3.80 - 5.20 MIL/uL   Hemoglobin 9.6 (L) 12.0 - 16.0 g/dL   HCT 16.1 (L) 09.6 - 04.5 %   MCV 85.6 80.0 - 100.0 fL   MCH 29.0 26.0 - 34.0 pg   MCHC 33.9 32.0 - 36.0 g/dL   RDW 40.9 81.1 - 91.4 %   Platelets 241 150 - 440 K/uL  Rapid HIV screen (HIV 1/2 Ab+Ag) ([facility] Only)     Status: None   Collection Time: 01/21/16  2:07 PM  Result Value Ref Range   HIV-1 P24 Antigen - HIV24 NON REACTIVE NON REACTIVE   HIV 1/2 Antibodies NON REACTIVE NON REACTIVE   Interpretation (HIV Ag Ab)      A non reactive test result means that HIV 1 or HIV 2 antibodies and HIV 1 p24 antigen were not detected in the specimen.    Lab Results  Component Value Date   CREATININE 0.65 01/21/2016   BUN 5* 01/21/2016   NA 137 01/21/2016   K 4.0 01/21/2016   CL 103 01/21/2016   CO2 17* 01/21/2016    Lab Results  Component Value Date   ALT 15 01/21/2016   AST 15 01/21/2016   ALKPHOS 106  01/21/2016   BILITOT <0.2 01/21/2016    Results for ZANDREA, KENEALY (MRN [id number]) as of 01/21/2016 17:49  Ref. Range 01/21/2016 11:54  Protein/Creat Ratio Latest Ref Range: 0-200 mg/g creat 763 (H)    01/08/2016: Ultrasound OB Indications: growth/AFI for size > dates Findings:  Singleton intrauterine pregnancy is visualized with FHR at 144 BPM. Biometrics give an (U/S) Gestational age of [redacted] weeks 1 day, and an (U/S) EDD of 01/28/16; this correlates with the clinically established EDD of 01/29/16.  Fetal presentation is vertex, spine right lateral.  EFW: 3053 grams ( 6 lbs. 12 oz. ). 48th percentile for growth per Mayford Knife. Placenta: Posterior, grade 2/3. AFI: Adequate at 19.7 cm.   Anatomic survey of the stomach, bladder, and kidneys appear WNL.    Impression: 1. 37 week 1 day Viable Singleton Intrauterine pregnancy by U/S. 2. (U/S) EDD is consistent with Clinically established (LMP) EDD of 01/29/16. 3. EFW and AFI appear WNL.  Assessment : FORREST DEMURO is a 25 y.o. G1P0 at [redacted]w[redacted]d with morbid obesity, h/o HTN being admitted for mild pre-eclampsia.  Plan: Labor:  Induction as needed, per protocol.  Will start on Labetalol and Mag Sulfate for BPs  systolics > 160 or diastolics >110 for pre-eclampsia.  Patient currently asymptomatic.  FWB: Reassuring fetal heart tracing.  GBS positive.  Will treat with ampicillin.  Delivery plan: Hopeful for vaginal delivery.     Hildred Laser, MD [facility]

## 2016-01-21 NOTE — Progress Notes (Signed)
Pt. BMI 49.7; difficult to maintain continuous fetal tracing when patient changes position. Explain to patient the importance of continuous fetal tracing while in labor.

## 2016-01-22 LAB — CBC WITH DIFFERENTIAL/PLATELET
BASOS ABS: 0 10*3/uL (ref 0.0–0.2)
Basos: 0 %
EOS (ABSOLUTE): 0 10*3/uL (ref 0.0–0.4)
Eos: 0 %
Hematocrit: 28.2 % — ABNORMAL LOW (ref 34.0–46.6)
Hemoglobin: 9.8 g/dL — ABNORMAL LOW (ref 11.1–15.9)
Immature Grans (Abs): 0 10*3/uL (ref 0.0–0.1)
Immature Granulocytes: 0 %
LYMPHS ABS: 1 10*3/uL (ref 0.7–3.1)
Lymphs: 19 %
MCH: 29 pg (ref 26.6–33.0)
MCHC: 34.8 g/dL (ref 31.5–35.7)
MCV: 83 fL (ref 79–97)
MONOS ABS: 0.5 10*3/uL (ref 0.1–0.9)
Monocytes: 9 %
Neutrophils Absolute: 3.8 10*3/uL (ref 1.4–7.0)
Neutrophils: 72 %
Platelets: 267 10*3/uL (ref 150–379)
RBC: 3.38 x10E6/uL — AB (ref 3.77–5.28)
RDW: 14.8 % (ref 12.3–15.4)
WBC: 5.4 10*3/uL (ref 3.4–10.8)

## 2016-01-22 LAB — COMPREHENSIVE METABOLIC PANEL
ALK PHOS: 106 IU/L (ref 39–117)
ALT: 15 IU/L (ref 0–32)
AST: 15 IU/L (ref 0–40)
Albumin/Globulin Ratio: 0.9 — ABNORMAL LOW (ref 1.2–2.2)
Albumin: 3.2 g/dL — ABNORMAL LOW (ref 3.5–5.5)
BUN/Creatinine Ratio: 8 — ABNORMAL LOW (ref 9–23)
BUN: 5 mg/dL — AB (ref 6–20)
Bilirubin Total: <0.2 mg/dL (ref 0.0–1.2)
CHLORIDE: 103 mmol/L (ref 96–106)
CO2: 17 mmol/L — AB (ref 18–29)
CREATININE: 0.65 mg/dL (ref 0.57–1.00)
Calcium: 9 mg/dL (ref 8.7–10.2)
GFR calc Af Amer: 145 mL/min/{1.73_m2} (ref >59)
GFR calc non Af Amer: 126 mL/min/{1.73_m2} (ref >59)
GLOBULIN, TOTAL: 3.4 g/dL (ref 1.5–4.5)
GLUCOSE: 63 mg/dL — AB (ref 65–99)
Potassium: 4 mmol/L (ref 3.5–5.2)
SODIUM: 137 mmol/L (ref 134–144)
Total Protein: 6.6 g/dL (ref 6.0–8.5)

## 2016-01-22 LAB — PROTEIN / CREATININE RATIO, URINE
CREATININE, UR: 37.1 mg/dL
PROTEIN UR: 28.3 mg/dL
Protein/Creat Ratio: 763 mg/g creat — ABNORMAL HIGH (ref 0–200)

## 2016-01-22 LAB — RPR: RPR: NONREACTIVE

## 2016-01-22 MED ORDER — CALCIUM GLUCONATE 10 % IV SOLN
INTRAVENOUS | Status: AC
Start: 2016-01-22 — End: 2016-01-22
  Filled 2016-01-22: qty 10

## 2016-01-22 MED ORDER — MAGNESIUM SULFATE 50 % IJ SOLN
2.0000 g/h | INTRAVENOUS | Status: AC
  Administered 2016-01-22 – 2016-01-23 (×2): 2 g/h via INTRAVENOUS
  Filled 2016-01-22: qty 80

## 2016-01-22 MED ORDER — LABETALOL HCL 5 MG/ML IV SOLN
INTRAVENOUS | Status: AC
  Filled 2016-01-22: qty 4

## 2016-01-22 MED ORDER — LABETALOL HCL 5 MG/ML IV SOLN
INTRAVENOUS | Status: AC
Start: 2016-01-22 — End: 2016-01-22
  Administered 2016-01-22: 20 mg via INTRAVENOUS
  Filled 2016-01-22: qty 4

## 2016-01-22 MED ORDER — MISOPROSTOL 25 MCG QUARTER TABLET
ORAL_TABLET | ORAL | Status: AC
  Administered 2016-01-22: 25 ug via VAGINAL
  Filled 2016-01-22: qty 0.25

## 2016-01-22 MED ORDER — MORPHINE SULFATE (PF) 10 MG/ML IV SOLN
INTRAVENOUS | Status: AC
  Administered 2016-01-22: 10 mg via INTRAMUSCULAR
  Filled 2016-01-22: qty 1

## 2016-01-22 MED ORDER — LABETALOL HCL 5 MG/ML IV SOLN
INTRAVENOUS | Status: AC
  Administered 2016-01-22: 20 mg via INTRAVENOUS
  Filled 2016-01-22: qty 4

## 2016-01-22 MED ORDER — LABETALOL HCL 5 MG/ML IV SOLN
20.0000 mg | INTRAVENOUS | Status: DC | PRN
  Filled 2016-01-22: qty 16

## 2016-01-22 MED ORDER — MORPHINE SULFATE (PF) 10 MG/ML IV SOLN: 10.0000 mg | Freq: Once | INTRAVENOUS | Status: DC

## 2016-01-22 MED ORDER — MORPHINE SULFATE (PF) 10 MG/ML IV SOLN
10.0000 mg | Freq: Once | INTRAVENOUS | Status: AC
  Administered 2016-01-22: 10 mg via INTRAMUSCULAR

## 2016-01-22 NOTE — Progress Notes (Signed)
Patient ID: Victoria Gibbs, female   DOB: [birth year], 25 y.o.   MRN: [id number]   Strip Review:  FHR: Category 1; episodes of monitoring demonstrate a negative CST. Contractions: Irregular, but as frequent as every 2-3 minutes.  BP 152/79 mmHg  Pulse 86  Temp(Src) 98.6 F (37 C) (Oral)  Resp 22  Ht 5\' 9"  (1.753 m)  Wt 336 lb (152.409 kg)  BMI 49.60 kg/m2  SpO2 100%  LMP 04/24/2015   Plan:  Continue Cytotec Cervical ripening. Morphine sulfate 10 mg IM for sleep tonight. Probable AROM in AM with transition to Pitocin IOL Continue Magnesium Sulfate prophylaxis. Will resume GBS prophylaxis when in active labor or at time of AROM  Herold HarmsMartin A Harvey Lingo, MD

## 2016-01-22 NOTE — Progress Notes (Signed)
Recvd per report from off-going nurse.  Resting in bed.  Awake and alert.  Plan of care discussed.  Agrees with plan.

## 2016-01-22 NOTE — Progress Notes (Signed)
Victoria Gibbs is a 25 y.o. G1P0 at 2123w0d by ultrasound admitted for induction of labor due to Mild PreEclampsia.  Subjective: No symptoms of PreE  Objective: BP 163/102 mmHg  Pulse 79  Temp(Src) 98.5 F (36.9 C) (Oral)  Resp 24  Ht 5\' 9"  (1.753 m)  Wt 336 lb (152.409 kg)  BMI 49.60 kg/m2  SpO2 100%  LMP 04/24/2015 I/O last 3 completed shifts: In: 2456.3 [I.V.:2306.3; IV Piggyback:150] Out: 1210 [Urine:1210] Total I/O In: 282.5 [I.V.:282.5] Out: -   FHT:  Difficult to monitor due to body habitus UC:   irregular SVE:   Dilation: 1 Effacement (%): 50 Station: -3 Exam by:: JCM   FT/30/-3/firm/vertex  Labs: Lab Results  Component Value Date   WBC 5.8 01/21/2016   HGB 9.6* 01/21/2016   HCT 28.2* 01/21/2016   MCV 85.6 01/21/2016   PLT 241 01/21/2016   CMP Latest Ref Rng 01/21/2016 02/10/2015 02/27/2013  Glucose 65 - 99 mg/dL 16(X63(L) 71 85  BUN 6 - 20 mg/dL 5(L) 12 10  Creatinine 0.57 - 1.00 mg/dL 0.960.65 0.450.78 0.7  Sodium 409134 - 144 mmol/L 137 138 136  Potassium 3.5 - 5.2 mmol/L 4.0 3.3(L) 3.8  Chloride 96 - 106 mmol/L 103 105 107  CO2 18 - 29 mmol/L 17(L) 27 21  Calcium 8.7 - 10.2 mg/dL 9.0 9.2 9.3  Total Protein 6.0 - 8.5 g/dL 6.6 8.1(X8.7(H) 8.3  Total Bilirubin 0.0 - 1.2 mg/dL <9.1<0.2 4.7(W0.2(L) 0.4  Alkaline Phos 39 - 117 IU/L 106 55 40  AST 0 - 40 IU/L 15 18 16   ALT 0 - 32 IU/L 15 17 14      Assessment / Plan: Unfavorable Cervix; Ripening needs to be continued with cytotec  Mild PreE, stable  Labor: Continue Cytotec ripening 25 mcg q 4-6 hours intravaginal Preeclampsia:  on magnesium sulfate, no signs or symptoms of toxicity and labs stable Fetal Wellbeing:  stable- FHT normal rate Pain Control:  Labor support without medications I/D:  n/a Anticipated MOD:  Uncertain  Raequan Vanschaick A Sayler Mickiewicz 01/22/2016, 9:12 AM

## 2016-01-23 ENCOUNTER — Inpatient Hospital Stay: Payer: BLUE CROSS/BLUE SHIELD | Admitting: Anesthesiology

## 2016-01-23 DIAGNOSIS — Z3403 Encounter for supervision of normal first pregnancy, third trimester: Secondary | ICD-10-CM

## 2016-01-23 LAB — COMPREHENSIVE METABOLIC PANEL
ALT: 16 U/L (ref 14–54)
ANION GAP: 9 (ref 5–15)
AST: 22 U/L (ref 15–41)
Albumin: 2.7 g/dL — ABNORMAL LOW (ref 3.5–5.0)
Alkaline Phosphatase: 104 U/L (ref 38–126)
BUN: 5 mg/dL — ABNORMAL LOW (ref 6–20)
CALCIUM: 8.4 mg/dL — AB (ref 8.9–10.3)
CHLORIDE: 107 mmol/L (ref 101–111)
CO2: 19 mmol/L — AB (ref 22–32)
CREATININE: 0.89 mg/dL (ref 0.44–1.00)
Glucose, Bld: 90 mg/dL (ref 65–99)
Potassium: 3.6 mmol/L (ref 3.5–5.1)
SODIUM: 135 mmol/L (ref 135–145)
Total Bilirubin: 0.7 mg/dL (ref 0.3–1.2)
Total Protein: 7 g/dL (ref 6.5–8.1)

## 2016-01-23 LAB — CBC
HCT: 29.5 % — ABNORMAL LOW (ref 35.0–47.0)
Hemoglobin: 9.9 g/dL — ABNORMAL LOW (ref 12.0–16.0)
MCH: 29.1 pg (ref 26.0–34.0)
MCHC: 33.5 g/dL (ref 32.0–36.0)
MCV: 86.6 fL (ref 80.0–100.0)
PLATELETS: 248 10*3/uL (ref 150–440)
RBC: 3.4 MIL/uL — ABNORMAL LOW (ref 3.80–5.20)
RDW: 14.7 % — ABNORMAL HIGH (ref 11.5–14.5)
WBC: 6.2 10*3/uL (ref 3.6–11.0)

## 2016-01-23 MED ORDER — OXYTOCIN 10 UNIT/ML IJ SOLN
INTRAMUSCULAR | Status: AC
  Filled 2016-01-23: qty 2

## 2016-01-23 MED ORDER — PHENYLEPHRINE 40 MCG/ML (10ML) SYRINGE FOR IV PUSH (FOR BLOOD PRESSURE SUPPORT): 80.0000 ug | PREFILLED_SYRINGE | INTRAVENOUS | Status: DC | PRN

## 2016-01-23 MED ORDER — FENTANYL 2.5 MCG/ML W/ROPIVACAINE 0.2% IN NS 100 ML EPIDURAL INFUSION (ARMC-ANES)
10.0000 mL/h | EPIDURAL | Status: DC
  Administered 2016-01-23: 10 mL/h via EPIDURAL
  Filled 2016-01-23: qty 100

## 2016-01-23 MED ORDER — LIDOCAINE-EPINEPHRINE (PF) 1.5 %-1:200000 IJ SOLN
INTRAMUSCULAR | Status: DC | PRN
  Administered 2016-01-23: 3 mg via PERINEURAL

## 2016-01-23 MED ORDER — EPHEDRINE 5 MG/ML INJ: 10.0000 mg | INTRAVENOUS | Status: DC | PRN

## 2016-01-23 MED ORDER — FENTANYL 2.5 MCG/ML W/ROPIVACAINE 0.2% IN NS 100 ML EPIDURAL INFUSION (ARMC-ANES)
EPIDURAL | Status: AC
  Administered 2016-01-23: 10 mL/h via EPIDURAL
  Filled 2016-01-23: qty 100

## 2016-01-23 MED ORDER — ROPIVACAINE HCL 2 MG/ML IJ SOLN
INTRAMUSCULAR | Status: DC | PRN
Start: 2016-01-23 — End: 2016-01-23
  Administered 2016-01-23: 10 mL/h via EPIDURAL

## 2016-01-23 MED ORDER — LACTATED RINGERS IV SOLN: 500.0000 mL | Freq: Once | INTRAVENOUS | Status: DC

## 2016-01-23 MED ORDER — DIPHENHYDRAMINE HCL 50 MG/ML IJ SOLN: 12.5000 mg | INTRAMUSCULAR | Status: DC | PRN

## 2016-01-23 NOTE — Progress Notes (Signed)
Patient ID: Victoria Gibbs, female   DOB: 1991-06-28, 25 y.o.   MRN: [id number] Labor Note:  S: Epidural in; Good pain relief.  O:BP 139/78 mmHg  Pulse 81  Temp(Src) 98.4 F (36.9 C) (Oral)  Resp 16  Ht 5\' 9"  (1.753 m)  Wt 336 lb (152.409 kg)  BMI 49.60 kg/m2  SpO2 100%  LMP 04/24/2015 Comfortable Cx: 2/80/-2/Vtx/AROM clear/FSE/IUPC FHR: Category 1;  Contractions: irregular UO: adequate DTR: present Edema: 3+  CBC Latest Ref Rng 01/23/2016 01/21/2016 01/21/2016  WBC 3.6 - 11.0 K/uL 6.2 5.8 5.4  Hemoglobin 12.0 - 16.0 g/dL 1.1(B9.9(L) 1.4(N9.6(L) -  Hematocrit 35.0 - 47.0 % 29.5(L) 28.2(L) 28.2(L)  Platelets 150 - 440 K/uL 248 241 267   CMP Latest Ref Rng 01/23/2016 01/21/2016 02/10/2015  Glucose 65 - 99 mg/dL 90 82(N63(L) 71  BUN 6 - 20 mg/dL 5(L) 5(L) 12  Creatinine 0.44 - 1.00 mg/dL 5.620.89 1.300.65 8.650.78  Sodium 135 - 145 mmol/L 135 137 138  Potassium 3.5 - 5.1 mmol/L 3.6 4.0 3.3(L)  Chloride 101 - 111 mmol/L 107 103 105  CO2 22 - 32 mmol/L 19(L) 17(L) 27  Calcium 8.9 - 10.3 mg/dL 7.8(I8.4(L) 9.0 9.2  Total Protein 6.5 - 8.1 g/dL 7.0 6.6 6.9(G8.7(H)  Total Bilirubin 0.3 - 1.2 mg/dL 0.7 <2.9<0.2 5.2(W0.2(L)  Alkaline Phos 38 - 126 U/L 104 106 55  AST 15 - 41 U/L 22 15 18   ALT 14 - 54 U/L 16 15 17    A: 39.1 week IUP with Mild Pre E Obesity 3 GBS + Slight hemoconcentration and bump in serum creatinine. Functioning epidural  P: 1. AROM with FSE/IUPC 2. Continue Magnesium 3. Restart GBS prophylaxis 4. Pitocin Augmentation started; goal >200 Montivideo units; max pitocin - 40 miu/min  Herold HarmsMartin A Jazz Biddy, MD

## 2016-01-23 NOTE — Progress Notes (Signed)
S: Feeling some discomfort  O:BP 145/89 mmHg  Pulse 88  Temp(Src) 98.3 F (36.8 C) (Oral)  Resp 16  Ht 5\' 9"  (1.753 m)  Wt 336 lb (152.409 kg)  BMI 49.60 kg/m2  SpO2 100%  LMP 04/24/2015 Cx: C/C/+1/OP FHR: Cat 1  A: Ready to push  P:  D/C foley til delivery Start pushing   Victoria HarmsMartin A Defrancesco, MD

## 2016-01-23 NOTE — Anesthesia Preprocedure Evaluation (Signed)
Anesthesia Evaluation  Patient identified by MRN, date of birth, ID band Patient awake    Reviewed: Allergy & Precautions, Patient's Chart, lab work & pertinent test results  Airway Mallampati: II       Dental   Pulmonary neg pulmonary ROS,           Cardiovascular hypertension, Pt. on medications      Neuro/Psych negative neurological ROS  negative psych ROS   GI/Hepatic negative GI ROS, Neg liver ROS,   Endo/Other  negative endocrine ROSMorbid obesity  Renal/GU negative Renal ROS  negative genitourinary   Musculoskeletal   Abdominal   Peds negative pediatric ROS (+)  Hematology  (+) anemia ,   Anesthesia Other Findings   Reproductive/Obstetrics (+) Pregnancy                             Anesthesia Physical Anesthesia Plan  ASA: II  Anesthesia Plan: Epidural   Post-op Pain Management:    Induction:   Airway Management Planned:   Additional Equipment:   Intra-op Plan:   Post-operative Plan:   Informed Consent: I have reviewed the patients History and Physical, chart, labs and discussed the procedure including the risks, benefits and alternatives for the proposed anesthesia with the patient or authorized representative who has indicated his/her understanding and acceptance.     Plan Discussed with:   Anesthesia Plan Comments:         Anesthesia Quick Evaluation

## 2016-01-23 NOTE — Anesthesia Procedure Notes (Signed)
Epidural Patient location during procedure: OB Start time: 01/23/2016 10:05 AM End time: 01/23/2016 10:33 AM  Preanesthetic Checklist Completed: patient identified, site marked, surgical consent, pre-op evaluation, timeout performed, IV checked, risks and benefits discussed and monitors and equipment checked  Epidural Patient position: sitting Prep: Betadine Patient monitoring: heart rate, continuous pulse ox and blood pressure Approach: midline Location: L4-L5 Injection technique: LOR saline  Needle:  Needle type: Tuohy  Needle gauge: 17 G Needle length: 9 cm and 9 Needle insertion depth: 9 cm Catheter type: closed end flexible Catheter size: 20 Guage Catheter at skin depth: 14 cm Test dose: negative and 1.5% lidocaine with Epi 1:200 K  Assessment Events: blood not aspirated, injection not painful, no injection resistance, negative IV test and no paresthesia  Additional Notes   Patient tolerated the insertion well without complications.Reason for block:procedure for pain

## 2016-01-23 NOTE — Progress Notes (Signed)
RN to the bedside, telemetry monitor switched to "plug-in" monitors; pt. Remains on cardiac monitor for post epidural placement.  Expecting Dr. Greggory Keenefrancesco, upon arrival.the patient. Will be AROM.

## 2016-01-23 NOTE — Progress Notes (Signed)
Per pt. Request...up to BR for BM attempt..."lots of gas, but I haven't been able to have a bowel movement since I've been here".  RN at the bedside to assist with ambulation to BR.  Pt. Has steady gait, RN at the bedside.

## 2016-01-23 NOTE — Progress Notes (Signed)
RN to the bedside for assist with a.m., shift assessment and to monitor patient.  Pt. Desires to change position - to right side, due to obesity (49.), and fetal position,  nurse unable to maintain continuous fetal tracing while pt. On right side. RN remains at the bedside to attempt continuous fetal tracing.

## 2016-01-23 NOTE — Progress Notes (Signed)
Day 2 Cytotec/Pitocin IOL for PreEclampsia  S: No Pre E Sxs. Feels occasional contraction.  O: BP 133/79 mmHg  Pulse 84  Temp(Src) 98.8 F (37.1 C) (Oral)  Resp 21  Ht 5\' 9"  (1.753 m)  Wt 336 lb (152.409 kg)  BMI 49.60 kg/m2  SpO2 100%  LMP 04/24/2015 Resting comfortably Cx: 1.5/70/-2/vtx/BOWI FHR: intermittent monitoring shows Category 1 tracing DTR present U/O adequate  A: Slow ripening Pre E Stable Obesity 3  P: Cytotec at 6 am Begin Pitocin at 1200 following AROM with IUPC and FSE Check CBC and PIH Panel this am  Herold HarmsMartin A Nicko Daher, MD

## 2016-01-24 ENCOUNTER — Encounter: Payer: Self-pay | Admitting: *Deleted

## 2016-01-24 LAB — CBC
HEMATOCRIT: 29.4 % — AB (ref 35.0–47.0)
HEMOGLOBIN: 10.1 g/dL — AB (ref 12.0–16.0)
MCH: 29.4 pg (ref 26.0–34.0)
MCHC: 34.4 g/dL (ref 32.0–36.0)
MCV: 85.4 fL (ref 80.0–100.0)
Platelets: 249 10*3/uL (ref 150–440)
RBC: 3.45 MIL/uL — AB (ref 3.80–5.20)
RDW: 14.4 % (ref 11.5–14.5)
WBC: 13.5 10*3/uL — ABNORMAL HIGH (ref 3.6–11.0)

## 2016-01-24 LAB — COMPREHENSIVE METABOLIC PANEL
ALBUMIN: 2.5 g/dL — AB (ref 3.5–5.0)
ALK PHOS: 106 U/L (ref 38–126)
ALT: 16 U/L (ref 14–54)
AST: 25 U/L (ref 15–41)
Anion gap: 10 (ref 5–15)
BILIRUBIN TOTAL: 0.4 mg/dL (ref 0.3–1.2)
BUN: 9 mg/dL (ref 6–20)
CALCIUM: 8.2 mg/dL — AB (ref 8.9–10.3)
CO2: 18 mmol/L — AB (ref 22–32)
Chloride: 104 mmol/L (ref 101–111)
Creatinine, Ser: 1.18 mg/dL — ABNORMAL HIGH (ref 0.44–1.00)
GFR calc non Af Amer: >60 mL/min (ref >60)
Glucose, Bld: 100 mg/dL — ABNORMAL HIGH (ref 65–99)
Potassium: 3.6 mmol/L (ref 3.5–5.1)
SODIUM: 132 mmol/L — AB (ref 135–145)
TOTAL PROTEIN: 6.7 g/dL (ref 6.5–8.1)

## 2016-01-24 MED ORDER — WITCH HAZEL-GLYCERIN EX PADS: 1.0000 "application " | MEDICATED_PAD | CUTANEOUS | Status: DC | PRN

## 2016-01-24 MED ORDER — OXYCODONE-ACETAMINOPHEN 5-325 MG PO TABS
1.0000 | ORAL_TABLET | ORAL | Status: DC | PRN
  Administered 2016-01-24: 1 via ORAL
  Filled 2016-01-24: qty 1

## 2016-01-24 MED ORDER — MEASLES, MUMPS & RUBELLA VAC ~~LOC~~ INJ
0.5000 mL | INJECTION | Freq: Once | SUBCUTANEOUS | Status: DC
Start: 2016-01-25 — End: 2016-01-25

## 2016-01-24 MED ORDER — DIBUCAINE 1 % RE OINT: 1.0000 "application " | TOPICAL_OINTMENT | RECTAL | Status: DC | PRN

## 2016-01-24 MED ORDER — MAGNESIUM SULFATE 50 % IJ SOLN
2.0000 g/h | INTRAMUSCULAR | Status: AC
Start: 2016-01-24 — End: 2016-01-24

## 2016-01-24 MED ORDER — COCONUT OIL OIL
1.0000 "application " | TOPICAL_OIL | Status: DC | PRN
  Administered 2016-01-24: 1 via TOPICAL
  Filled 2016-01-24: qty 120

## 2016-01-24 MED ORDER — BENZOCAINE-MENTHOL 20-0.5 % EX AERO
1.0000 "application " | INHALATION_SPRAY | CUTANEOUS | Status: DC | PRN
  Administered 2016-01-24: 1 via TOPICAL
  Filled 2016-01-24: qty 56

## 2016-01-24 MED ORDER — MAGNESIUM SULFATE 50 % IJ SOLN
2.0000 g/h | INTRAVENOUS | Status: AC
  Administered 2016-01-24: 2 g/h via INTRAVENOUS
  Filled 2016-01-24: qty 80

## 2016-01-24 MED ORDER — SIMETHICONE 80 MG PO CHEW: 80.0000 mg | CHEWABLE_TABLET | ORAL | Status: DC | PRN

## 2016-01-24 MED ORDER — LACTATED RINGERS IV SOLN
INTRAVENOUS | Status: DC
  Administered 2016-01-24: 07:00:00 via INTRAVENOUS

## 2016-01-24 MED ORDER — IBUPROFEN 800 MG PO TABS
800.0000 mg | ORAL_TABLET | Freq: Three times a day (TID) | ORAL | Status: DC
Start: 2016-01-24 — End: 2016-01-25
  Administered 2016-01-24 – 2016-01-25 (×4): 800 mg via ORAL
  Filled 2016-01-24 (×4): qty 1

## 2016-01-24 MED ORDER — OXYCODONE-ACETAMINOPHEN 5-325 MG PO TABS
2.0000 | ORAL_TABLET | ORAL | Status: DC | PRN
  Administered 2016-01-24: 2 via ORAL

## 2016-01-24 MED ORDER — ONDANSETRON HCL 4 MG/2ML IJ SOLN: 4.0000 mg | INTRAMUSCULAR | Status: DC | PRN

## 2016-01-24 MED ORDER — DOCUSATE SODIUM 100 MG PO CAPS
100.0000 mg | ORAL_CAPSULE | Freq: Two times a day (BID) | ORAL | Status: DC
  Administered 2016-01-25 (×2): 100 mg via ORAL
  Filled 2016-01-24 (×2): qty 1

## 2016-01-24 MED ORDER — FERROUS SULFATE 325 (65 FE) MG PO TABS
325.0000 mg | ORAL_TABLET | Freq: Two times a day (BID) | ORAL | Status: DC
  Administered 2016-01-24 – 2016-01-25 (×3): 325 mg via ORAL
  Filled 2016-01-24 (×3): qty 1

## 2016-01-24 MED ORDER — DIPHENHYDRAMINE HCL 25 MG PO CAPS: 25.0000 mg | ORAL_CAPSULE | Freq: Four times a day (QID) | ORAL | Status: DC | PRN

## 2016-01-24 MED ORDER — PRENATAL MULTIVITAMIN CH
1.0000 | ORAL_TABLET | Freq: Every day | ORAL | Status: DC
  Administered 2016-01-24 – 2016-01-25 (×2): 1 via ORAL
  Filled 2016-01-24 (×2): qty 1

## 2016-01-24 MED ORDER — ACETAMINOPHEN 325 MG PO TABS: 650.0000 mg | ORAL_TABLET | ORAL | Status: DC | PRN

## 2016-01-24 MED ORDER — TETANUS-DIPHTH-ACELL PERTUSSIS 5-2.5-18.5 LF-MCG/0.5 IM SUSP: 0.5000 mL | INTRAMUSCULAR | Status: DC | PRN

## 2016-01-24 MED ORDER — ONDANSETRON HCL 4 MG PO TABS: 4.0000 mg | ORAL_TABLET | ORAL | Status: DC | PRN

## 2016-01-24 NOTE — Anesthesia Postprocedure Evaluation (Signed)
Anesthesia Post Note  Patient: Victoria CoombeAja R Gibbs  Procedure(s) Performed: * No procedures listed *  Patient location during evaluation: [facility] Anesthesia Type: Epidural Level of consciousness: awake and alert and oriented Pain management: pain level controlled Vital Signs Assessment: post-procedure vital signs reviewed and stable Respiratory status: spontaneous breathing Cardiovascular status: blood pressure returned to baseline and stable Postop Assessment: no headache, no backache, no signs of nausea or vomiting and patient able to bend at knees Anesthetic complications: no    Last Vitals:  Filed Vitals:   01/24/16 1222 01/24/16 1230  BP: 150/82   Pulse: 93   Temp:    Resp:  20    Last Pain:  Filed Vitals:   01/24/16 1304  PainSc: 3                  Everlene FarrierWalker,  Lilybeth Vien W

## 2016-01-24 NOTE — Progress Notes (Signed)
Post Partum Day 1 Subjective: no complaints, tolerating PO and Magnesium Sulfate prophylaxis continues  Objective: Blood pressure 150/82, pulse 93, temperature 98.1 F (36.7 C), temperature source Oral, resp. rate 20, height 5\' 9"  (1.753 m), weight 336 lb (152.409 kg), last menstrual period 04/24/2015, SpO2 100 %, unknown if currently breastfeeding.  Physical Exam:  General: alert and cooperative Lochia: appropriate Uterine Fundus: firm Incision: healing well DVT Evaluation: No evidence of DVT seen on physical exam. Calf/Ankle edema is present.  CBC Latest Ref Rng 01/24/2016 01/23/2016 01/21/2016  WBC 3.6 - 11.0 K/uL 13.5(H) 6.2 5.8  Hemoglobin 12.0 - 16.0 g/dL 10.1(L) 9.9(L) 9.6(L)  Hematocrit 35.0 - 47.0 % 29.4(L) 29.5(L) 28.2(L)  Platelets 150 - 440 K/uL 249 248 241   CMP Latest Ref Rng 01/24/2016 01/23/2016 01/21/2016  Glucose 65 - 99 mg/dL 409(W100(H) 90 11(B63(L)  BUN 6 - 20 mg/dL 9 5(L) 5(L)  Creatinine 0.44 - 1.00 mg/dL 1.47(W1.18(H) 2.950.89 6.210.65  Sodium 135 - 145 mmol/L 132(L) 135 137  Potassium 3.5 - 5.1 mmol/L 3.6 3.6 4.0  Chloride 101 - 111 mmol/L 104 107 103  CO2 22 - 32 mmol/L 18(L) 19(L) 17(L)  Calcium 8.9 - 10.3 mg/dL 8.2(L) 8.4(L) 9.0  Total Protein 6.5 - 8.1 g/dL 6.7 7.0 6.6  Total Bilirubin 0.3 - 1.2 mg/dL 0.4 0.7 <3.0<0.2  Alkaline Phos 38 - 126 U/L 106 104 106  AST 15 - 41 U/L 25 22 15   ALT 14 - 54 U/L 16 16 15      Assessment/Plan: PPD#1, stable Mild Pre Eclampsia; slight increase in Creatinine S/P ILFD <45 degrees for Maternal Exhaustion   Continue Magnesium Sulfate until 0100 on 01/25/2016; watch for toxicity Transfer to Post partum after 0100 on 01/25/2016 Recheck CBC and CMP in am.   LOS: 3 days   Prentice DockerMartin A Elvyn Krohn 01/24/2016, 1:47 PM

## 2016-01-25 DIAGNOSIS — O1494 Unspecified pre-eclampsia, complicating childbirth: Secondary | ICD-10-CM

## 2016-01-25 LAB — CBC WITH DIFFERENTIAL/PLATELET
BASOS ABS: 0 10*3/uL (ref 0–0.1)
EOS ABS: 0 10*3/uL (ref 0–0.7)
Eosinophils Relative: 0 %
HEMATOCRIT: 23 % — AB (ref 35.0–47.0)
HEMOGLOBIN: 7.9 g/dL — AB (ref 12.0–16.0)
Lymphocytes Relative: 11 %
Lymphs Abs: 1.1 10*3/uL (ref 1.0–3.6)
MCH: 29.8 pg (ref 26.0–34.0)
MCHC: 34.4 g/dL (ref 32.0–36.0)
MCV: 86.7 fL (ref 80.0–100.0)
Monocytes Absolute: 0.6 10*3/uL (ref 0.2–0.9)
Monocytes Relative: 6 %
NEUTROS ABS: 8.4 10*3/uL — AB (ref 1.4–6.5)
Platelets: 204 10*3/uL (ref 150–440)
RBC: 2.65 MIL/uL — ABNORMAL LOW (ref 3.80–5.20)
RDW: 14.7 % — ABNORMAL HIGH (ref 11.5–14.5)
WBC: 10.1 10*3/uL (ref 3.6–11.0)

## 2016-01-25 LAB — COMPREHENSIVE METABOLIC PANEL
ALBUMIN: 2.1 g/dL — AB (ref 3.5–5.0)
ALT: 13 U/L — ABNORMAL LOW (ref 14–54)
ANION GAP: 8 (ref 5–15)
AST: 19 U/L (ref 15–41)
Alkaline Phosphatase: 78 U/L (ref 38–126)
BILIRUBIN TOTAL: 0.3 mg/dL (ref 0.3–1.2)
BUN: 11 mg/dL (ref 6–20)
CALCIUM: 8.1 mg/dL — AB (ref 8.9–10.3)
CO2: 21 mmol/L — AB (ref 22–32)
Chloride: 107 mmol/L (ref 101–111)
Creatinine, Ser: 1.08 mg/dL — ABNORMAL HIGH (ref 0.44–1.00)
GFR calc Af Amer: >60 mL/min (ref >60)
GFR calc non Af Amer: >60 mL/min (ref >60)
GLUCOSE: 75 mg/dL (ref 65–99)
Potassium: 3.6 mmol/L (ref 3.5–5.1)
SODIUM: 136 mmol/L (ref 135–145)
TOTAL PROTEIN: 5.8 g/dL — AB (ref 6.5–8.1)

## 2016-01-25 MED ORDER — DOCUSATE SODIUM 100 MG PO CAPS: 100.0000 mg | ORAL_CAPSULE | Freq: Two times a day (BID) | ORAL | Status: DC

## 2016-01-25 MED ORDER — IBUPROFEN 800 MG PO TABS: 800.0000 mg | ORAL_TABLET | Freq: Three times a day (TID) | ORAL | Status: DC

## 2016-01-25 NOTE — Discharge Instructions (Signed)
Follow up sooner with fever, problems breathing, pain not helped by medications, severe depression( more than just baby blues, wanting to hurt yourself or the baby), severe bleeding ( saturating more than one pad an hour or large palm sized clots), no heavy lifting , no driving while taking narcotics, no douches, intercourse, tampons or enemas for 6 weeks  °

## 2016-01-25 NOTE — Progress Notes (Signed)
All discharge instructions given to mom and she voiced understanding of all instructions given. Prescriptions were  sent to pharmacy , pt discharged home with spouse with baby in arms escorted out in wheelchair by lpn.

## 2016-01-25 NOTE — Discharge Summary (Signed)
Physician Obstetric Discharge Summary  Patient ID: Victoria Gibbs MRN: [id number] DOB/AGE: [birth year] 24 y.o.   Date of Admission: 01/21/2016  Date of Discharge: 01/25/2016  Admitting Diagnosis: Induction of labor and Pre Eclampsia; Obesity 3; GBS + at [redacted]w[redacted]d  Secondary Diagnosis: NA  Mode of Delivery: forceps delivery (ILFD <45 degrees for Maternal Exhaustion, following Manual rotation from LOP to ROA.     Discharge Diagnosis: TIUP, Delivered; Mild Pre Eclampsia; Obesity 3; GBS+   Intrapartum Procedures: Atificial rupture of membranes, epidural, GBS prophylaxis, laceration 2nd, pitocin augmentation, placement of fetal scalp electrode, placement of intrauterine catheter and Manual Rotation LOP to ROA and ILFD   Post partum procedures: Magnesium Sulfate prophylaxis  Complications: none   Brief Hospital Course  Victoria Gibbs is a G1P1001 who had a SVD on 01/23/2016;  for further details of this surgery, please refer to the delivey note.  Patient had an uncomplicated postpartum course.  By time of discharge on PPD#2, her pain was controlled on oral pain medications; she had appropriate lochia and was ambulating, voiding without difficulty and tolerating regular diet.  She was deemed stable for discharge to home.      Labs: CBC Latest Ref Rng 01/25/2016 01/24/2016 01/23/2016  WBC 3.6 - 11.0 K/uL 10.1 13.5(H) 6.2  Hemoglobin 12.0 - 16.0 g/dL 7.9(L) 10.1(L) 9.9(L)  Hematocrit 35.0 - 47.0 % 23.0(L) 29.4(L) 29.5(L)  Platelets 150 - 440 K/uL 204 249 248   CMP Latest Ref Rng 01/25/2016 01/24/2016 01/23/2016  Glucose 65 - 99 mg/dL 75 782(N) 90  BUN 6 - 20 mg/dL 11 9 5(L)  Creatinine 5.62 - 1.00 mg/dL 1.30(Q) 6.57(Q) 4.69  Sodium 135 - 145 mmol/L 136 132(L) 135  Potassium 3.5 - 5.1 mmol/L 3.6 3.6 3.6  Chloride 101 - 111 mmol/L 107 104 107  CO2 22 - 32 mmol/L 21(L) 18(L) 19(L)  Calcium 8.9 - 10.3 mg/dL 8.1(L) 8.2(L) 8.4(L)  Total Protein 6.5 - 8.1 g/dL 6.2(X) 6.7 7.0  Total Bilirubin 0.3 - 1.2  mg/dL 0.3 0.4 0.7  Alkaline Phos 38 - 126 U/L 78 106 104  AST 15 - 41 U/L ALT 14 - 54 U/L 13(L) 16 16     A POS  Physical exam:  Blood pressure 154/76, pulse 88, temperature 98.6 F (37 C), temperature source Oral, resp. rate 18, height  (1.753 m), weight 336 lb (152.409 kg), last menstrual period 04/24/2015, SpO2 100 %, unknown if currently breastfeeding. General: alert and no distress Lochia: appropriate Abdomen: soft, NT Uterine Fundus: firm Extremities: No evidence of DVT seen on physical exam. No lower extremity edema.  Discharge Instructions: Per After Visit Summary. Activity: Advance as tolerated. Pelvic rest for 6 weeks.  Also refer to After Visit Summary Diet: Regular Medications:   Medication List    STOP taking these medications        amoxicillin 500 MG capsule  Commonly known as:  AMOXIL      TAKE these medications        docusate sodium 100 MG capsule  Commonly known as:  COLACE  Take 1 capsule (100 mg total) by mouth 2 (two) times daily.     ferrous fumarate 325 (106 Fe) MG Tabs tablet  Commonly known as:  HEMOCYTE - 106 mg FE  Take 1 tablet by mouth.     ibuprofen 800 MG tablet  Commonly known as:  ADVIL,MOTRIN  Take 1 tablet (800 mg total) by mouth 3 (three) times daily.  prenatal multivitamin Tabs tablet  Take 1 tablet by mouth daily at 12 noon.       Outpatient follow up:      Follow-up Information    Follow up with Herold HarmsMartin A Tara Wich, MD.   Specialties:  Obstetrics and Gynecology, Radiology   Why:  BP and CBC;, 6 week Post Partum Check   Contact information:   [address] [place] [address] [phone]      Postpartum contraception: condoms  Discharged Condition: good  Discharged to: home   Newborn Data: Disposition:home with mother  Apgars: APGAR (1 MIN): 8   APGAR (5 MINS): 9   APGAR (10 MINS):    Baby Feeding: Breast  Herold HarmsMartin A Floy Angert, MD

## 2016-01-27 LAB — SURGICAL PATHOLOGY

## 2016-02-04 ENCOUNTER — Ambulatory Visit: Payer: BLUE CROSS/BLUE SHIELD | Admitting: Family Medicine

## 2016-03-11 ENCOUNTER — Ambulatory Visit: Payer: BLUE CROSS/BLUE SHIELD | Admitting: Obstetrics and Gynecology

## 2016-03-18 ENCOUNTER — Encounter: Payer: Self-pay | Admitting: Obstetrics and Gynecology

## 2016-03-18 ENCOUNTER — Ambulatory Visit (INDEPENDENT_AMBULATORY_CARE_PROVIDER_SITE_OTHER): Payer: BLUE CROSS/BLUE SHIELD | Admitting: Obstetrics and Gynecology

## 2016-03-18 DIAGNOSIS — A5901 Trichomonal vulvovaginitis: Secondary | ICD-10-CM

## 2016-03-18 DIAGNOSIS — E669 Obesity, unspecified: Secondary | ICD-10-CM

## 2016-03-18 MED ORDER — TINIDAZOLE 500 MG PO TABS: 2.0000 g | ORAL_TABLET | Freq: Every day | ORAL | Status: DC

## 2016-03-18 MED ORDER — NORETHINDRONE 0.35 MG PO TABS: 1.0000 | ORAL_TABLET | Freq: Every day | ORAL | Status: DC

## 2016-03-18 NOTE — Patient Instructions (Addendum)
  Place postpartum visit patient instructions here.   Trichomoniasis Trichomoniasis is an infection caused by an organism called Trichomonas. The infection can affect both women and men. In women, the outer female genitalia and the vagina are affected. In men, the penis is mainly affected, but the prostate and other reproductive organs can also be involved. Trichomoniasis is a sexually transmitted infection (STI) and is most often passed to another person through sexual contact.  RISK FACTORS  Having unprotected sexual intercourse.  Having sexual intercourse with an infected partner. SIGNS AND SYMPTOMS  Symptoms of trichomoniasis in women include:  Abnormal gray-green frothy vaginal discharge.  Itching and irritation of the vagina.  Itching and irritation of the area outside the vagina. Symptoms of trichomoniasis in men include:   Penile discharge with or without pain.  Pain during urination. This results from inflammation of the urethra. DIAGNOSIS  Trichomoniasis may be found during a Pap test or physical exam. Your health care provider may use one of the following methods to help diagnose this infection:  Testing the pH of the vagina with a test tape.  Using a vaginal swab test that checks for the Trichomonas organism. A test is available that provides results within a few minutes.  Examining a urine sample.  Testing vaginal secretions. Your health care provider may test you for other STIs, including HIV. TREATMENT   You may be given medicine to fight the infection. Women should inform their health care provider if they could be or are pregnant. Some medicines used to treat the infection should not be taken during pregnancy.  Your health care provider may recommend over-the-counter medicines or creams to decrease itching or irritation.  Your sexual partner will need to be treated if infected.  Your health care provider may test you for infection again 3 months after  treatment. HOME CARE INSTRUCTIONS   Take medicines only as directed by your health care provider.  Take over-the-counter medicine for itching or irritation as directed by your health care provider.  Do not have sexual intercourse while you have the infection.  Women should not douche or wear tampons while they have the infection.  Discuss your infection with your partner. Your partner may have gotten the infection from you, or you may have gotten it from your partner.  Have your sex partner get examined and treated if necessary.  Practice safe, informed, and protected sex.  See your health care provider for other STI testing. SEEK MEDICAL CARE IF:   You still have symptoms after you finish your medicine.  You develop abdominal pain.  You have pain when you urinate.  You have bleeding after sexual intercourse.  You develop a rash.  Your medicine makes you sick or makes you throw up (vomit). MAKE SURE YOU:  Understand these instructions.  Will watch your condition.  Will get help right away if you are not doing well or get worse.   This information is not intended to replace advice given to you by your health care provider. Make sure you discuss any questions you have with your health care provider.   Document Released: 03/01/2001 Document Revised: 09/26/2014 Document Reviewed: 06/17/2013 Elsevier Interactive Patient Education [employer]2016 Else[employer] Inc.

## 2016-03-18 NOTE — Progress Notes (Signed)
  Subjective:     Victoria Gibbs is a 25 y.o. female who presents for a postpartum visit. She is 7 weeks postpartum following a mid forceps delivery. I have fully reviewed the prenatal and intrapartum course. The delivery was at 39 gestational weeks. Outcome: forceps, mid. Anesthesia: epidural. Postpartum course has been uncomplicated. Baby's course has been uncomplicated. Baby is feeding by breast. Bleeding staining only. Bowel function is normal. Bladder function is normal. Patient is not sexually active. Contraception method is abstinence. Postpartum depression screening: negative.  The following portions of the patient's history were reviewed and updated as appropriate: allergies, current medications, past family history, past medical history, past social history, past surgical history and problem list.  Review of Systems A comprehensive review of systems was negative.   Objective:    BP 123/78 mmHg  Pulse 93  Ht 5\' 9"  (1.753 m)  Wt 290 lb 8 oz (131.77 kg)  BMI 42.88 kg/m2  Breastfeeding? Yes  General:  alert, cooperative, appears stated age and morbidly obese   Breasts:  inspection negative, no nipple discharge or bleeding, no masses or nodularity palpable  Lungs: clear to auscultation bilaterally  Heart:  regular rate and rhythm, S1, S2 normal, no murmur, click, rub or gallop  Abdomen: soft, non-tender; bowel sounds normal; no masses,  no organomegaly   Vulva:  normal  Vagina: vagina negative for discharge  Cervix:  multiparous appearance and discharge  Corpus: normal size, contour, position, consistency, mobility, non-tender  Adnexa:  no mass, fullness, tenderness  Rectal Exam: Not performed.       Microscopic wet-mount exam shows excessive bacteria, trichomonads, white blood cells. Assessment:     7 weeks postpartum exam. Pap smear not done at today's visit.  Trichomaniasis infection Plan:    1. Contraception: oral progesterone-only contraceptive 2. TOC in 4 weeks 3.  Follow up in: 4 months for AE or as needed.

## 2016-03-19 LAB — CBC
HEMATOCRIT: 31.1 % — AB (ref 34.0–46.6)
HEMOGLOBIN: 9.8 g/dL — AB (ref 11.1–15.9)
MCH: 26.6 pg (ref 26.6–33.0)
MCHC: 31.5 g/dL (ref 31.5–35.7)
MCV: 84 fL (ref 79–97)
PLATELETS: 447 10*3/uL — AB (ref 150–379)
RBC: 3.69 x10E6/uL — AB (ref 3.77–5.28)
RDW: 14.5 % (ref 12.3–15.4)
WBC: 6.4 10*3/uL (ref 3.4–10.8)

## 2016-03-19 LAB — COMPREHENSIVE METABOLIC PANEL
ALBUMIN: 4.1 g/dL (ref 3.5–5.5)
ALK PHOS: 72 IU/L (ref 39–117)
ALT: 10 IU/L (ref 0–32)
AST: 11 IU/L (ref 0–40)
Albumin/Globulin Ratio: 1 — ABNORMAL LOW (ref 1.2–2.2)
BUN / CREAT RATIO: 13 (ref 9–23)
BUN: 10 mg/dL (ref 6–20)
Bilirubin Total: <0.2 mg/dL (ref 0.0–1.2)
CALCIUM: 9.6 mg/dL (ref 8.7–10.2)
CO2: 22 mmol/L (ref 18–29)
CREATININE: 0.78 mg/dL (ref 0.57–1.00)
Chloride: 100 mmol/L (ref 96–106)
GFR calc Af Amer: 124 mL/min/{1.73_m2} (ref >59)
GFR, EST NON AFRICAN AMERICAN: 107 mL/min/{1.73_m2} (ref >59)
GLOBULIN, TOTAL: 4.1 g/dL (ref 1.5–4.5)
GLUCOSE: 77 mg/dL (ref 65–99)
Potassium: 4.4 mmol/L (ref 3.5–5.2)
Sodium: 140 mmol/L (ref 134–144)
TOTAL PROTEIN: 8.2 g/dL (ref 6.0–8.5)

## 2016-03-19 LAB — IRON: Iron: 23 ug/dL — ABNORMAL LOW (ref 27–159)

## 2016-03-19 LAB — VITAMIN D 25 HYDROXY (VIT D DEFICIENCY, FRACTURES): Vit D, 25-Hydroxy: 15.9 ng/mL — ABNORMAL LOW (ref 30.0–100.0)

## 2016-03-23 ENCOUNTER — Ambulatory Visit: Payer: BLUE CROSS/BLUE SHIELD | Admitting: Obstetrics and Gynecology

## 2016-03-23 ENCOUNTER — Other Ambulatory Visit: Payer: Self-pay | Admitting: Obstetrics and Gynecology

## 2016-03-23 DIAGNOSIS — E559 Vitamin D deficiency, unspecified: Secondary | ICD-10-CM

## 2016-03-23 DIAGNOSIS — O9081 Anemia of the puerperium: Secondary | ICD-10-CM

## 2016-03-23 MED ORDER — FUSION PLUS PO CAPS: 1.0000 | ORAL_CAPSULE | Freq: Every day | ORAL | Status: DC

## 2016-03-23 MED ORDER — VITAMIN D (ERGOCALCIFEROL) 1.25 MG (50000 UNIT) PO CAPS: 50000.0000 [IU] | ORAL_CAPSULE | ORAL | Status: DC

## 2016-04-27 ENCOUNTER — Ambulatory Visit: Payer: BLUE CROSS/BLUE SHIELD | Admitting: Obstetrics and Gynecology

## 2016-06-13 IMAGING — US US OB COMP LESS 14 WK
1 series · 14 of 28 positions shown · non-contrast
Comparison: None

CLINICAL DATA: Light vaginal bleeding for 3 days in first trimester
pregnancy, quantitative beta HCG = 56,416

EXAM:
OBSTETRIC <14 WK US AND TRANSVAGINAL OB US
TECHNIQUE: Both transabdominal and transvaginal ultrasound examinations were
performed for complete evaluation of the gestation as well as the
maternal uterus, adnexal regions, and pelvic cul-de-sac.
Transvaginal technique was performed to assess early pregnancy.

[Series 1: us ob comp less 14 wk · 0.22mm/px · 14 of 56 slices shown]
[im 3/56]
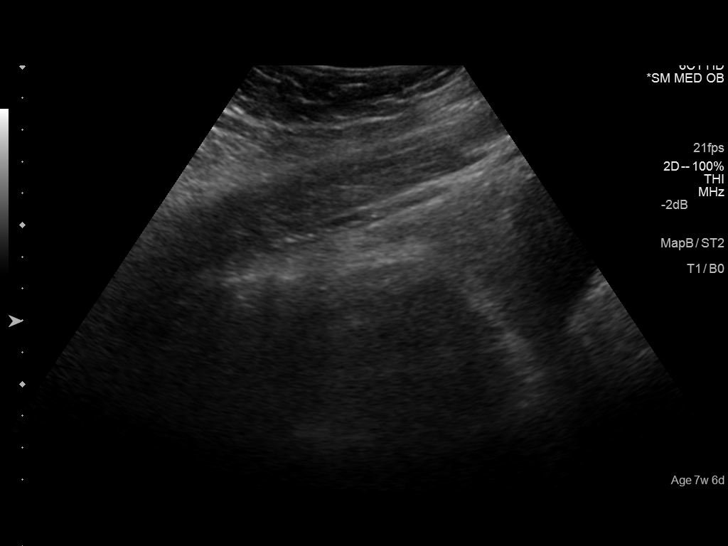
[im 7/56]
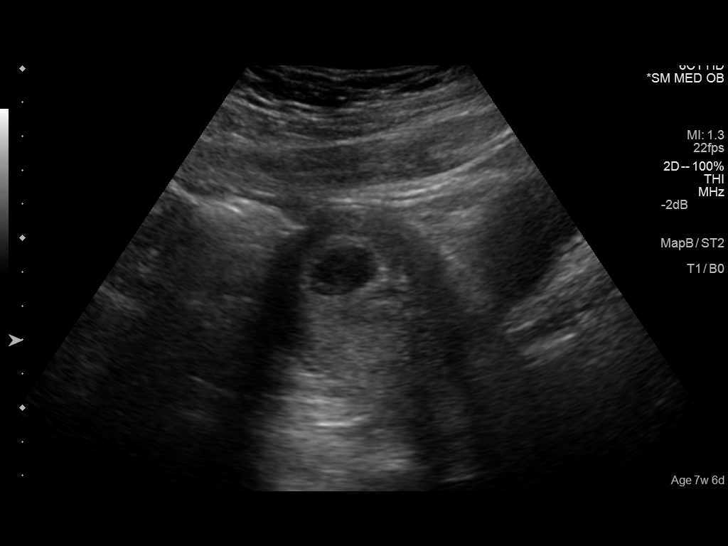
[im 11/56]
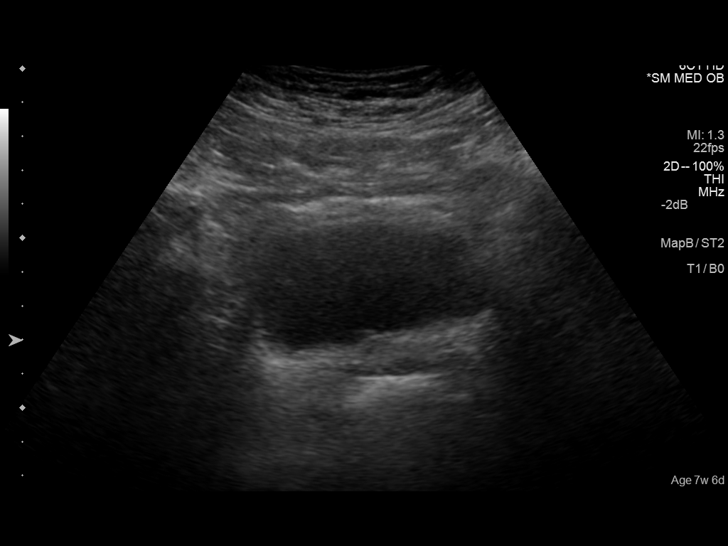
[im 15/56]
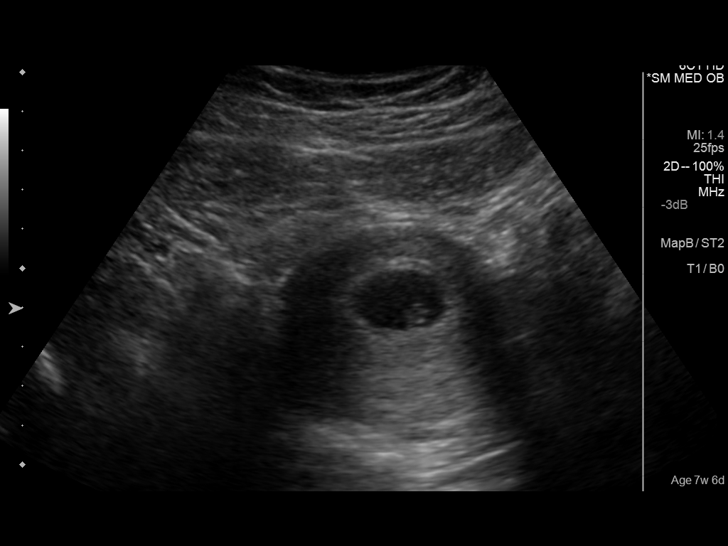
[im 19/56]
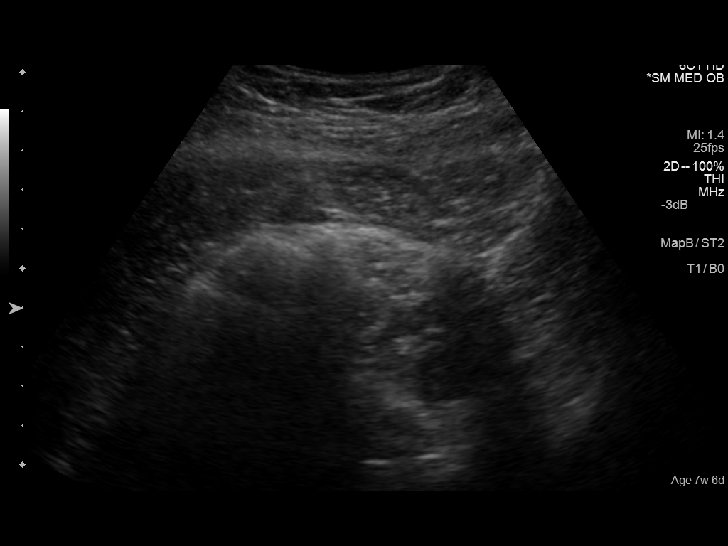
[im 23/56]
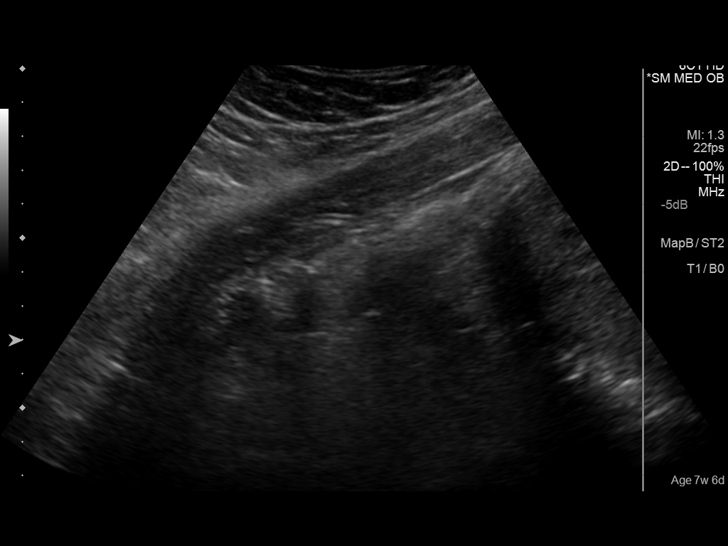
[im 27/56]
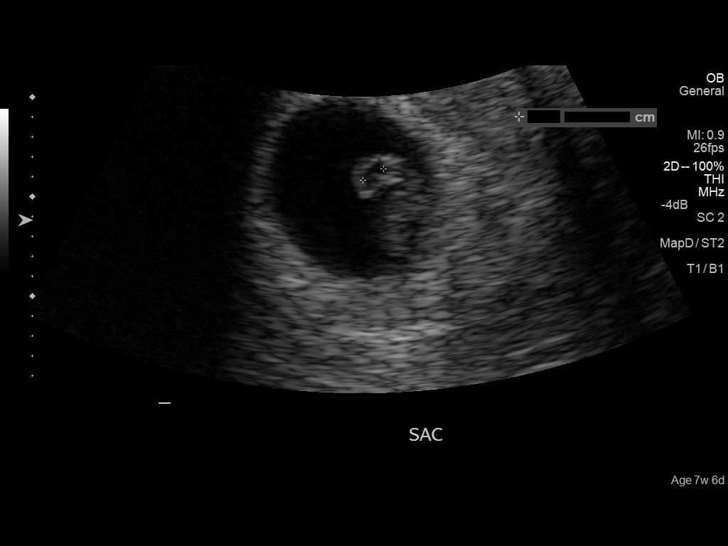
[im 31/56]
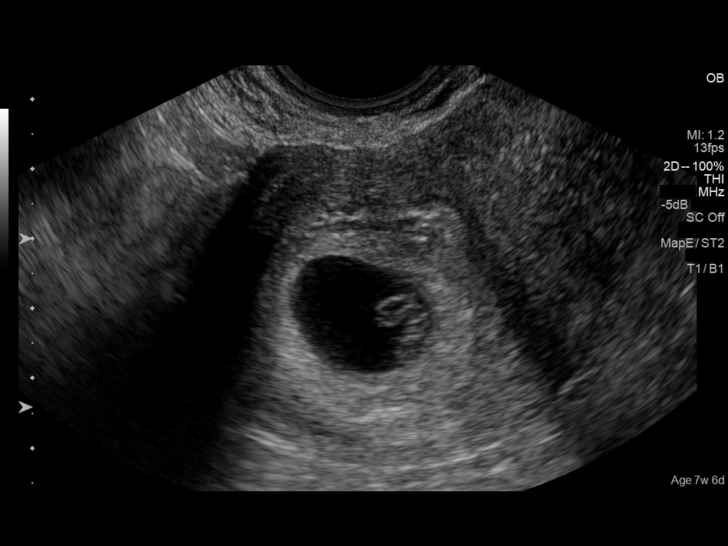
[im 35/56]
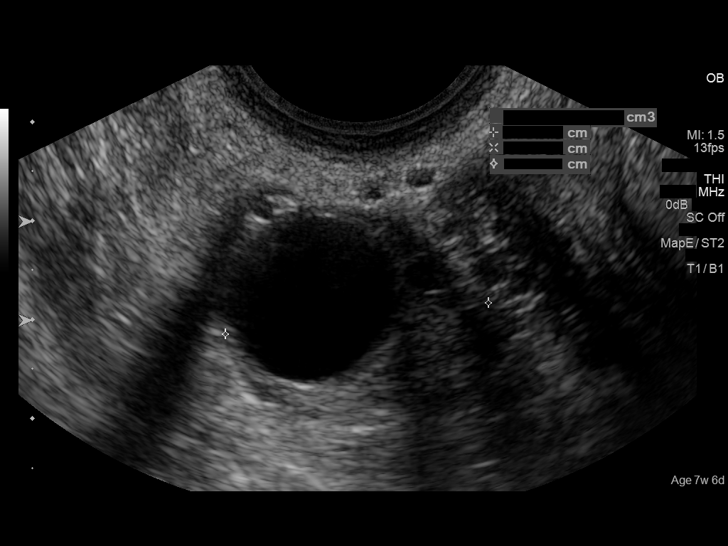
[im 39/56]
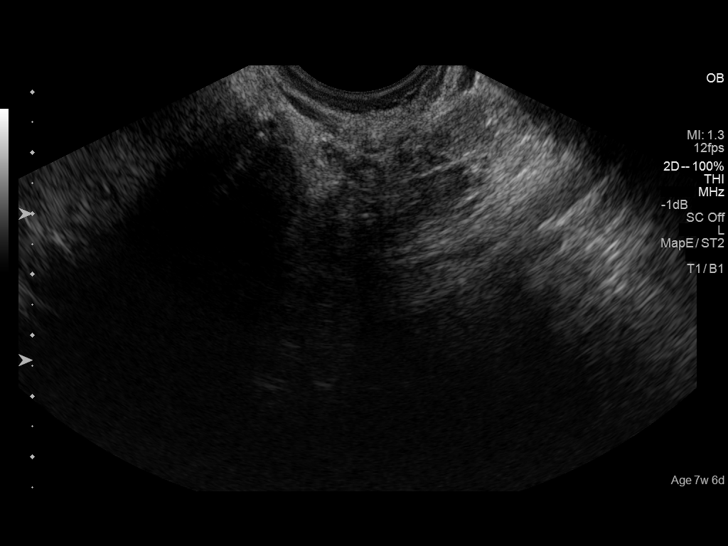
[im 43/56]
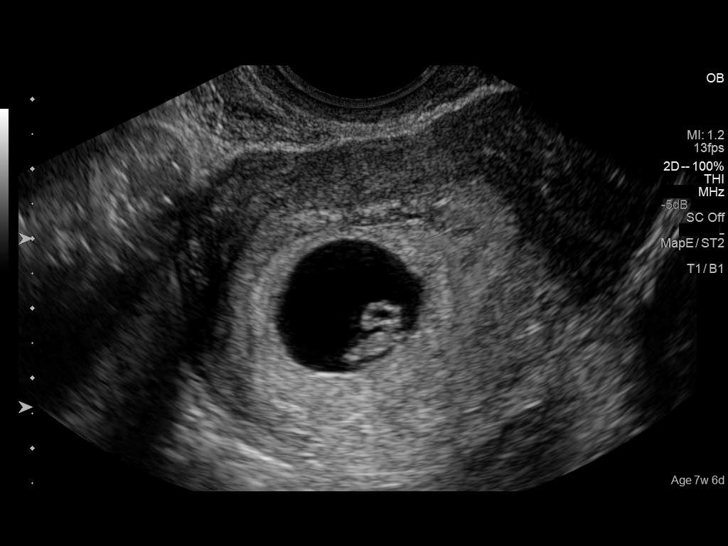
[im 47/56]
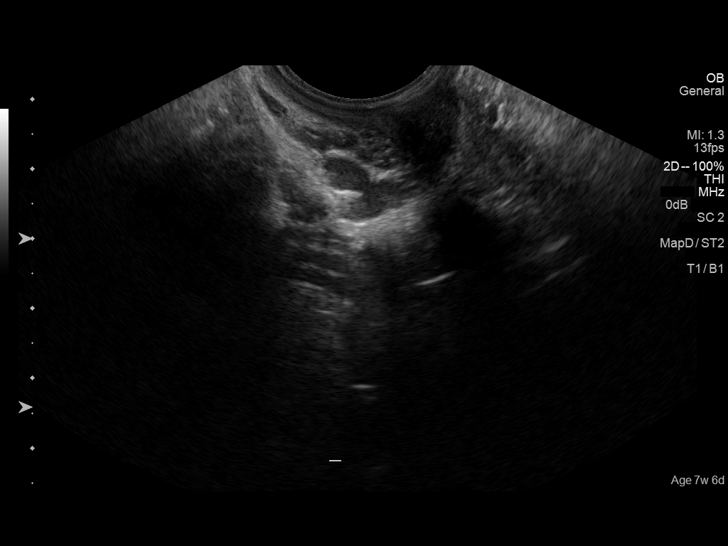
[im 51/56]
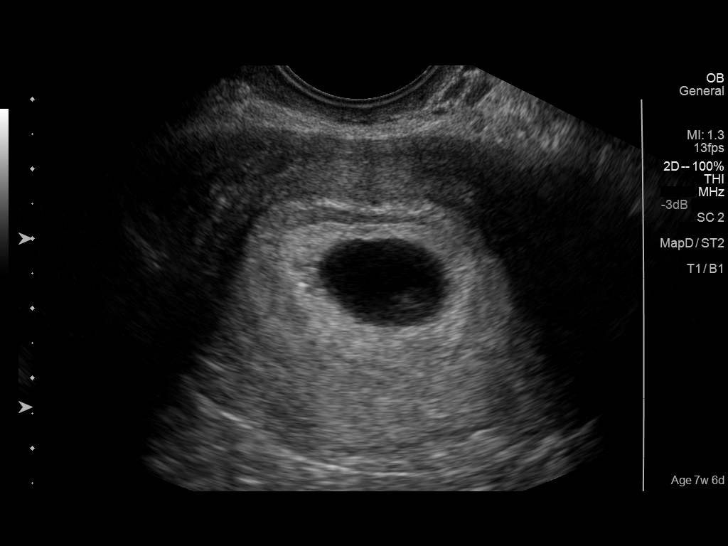
[im 56/56]
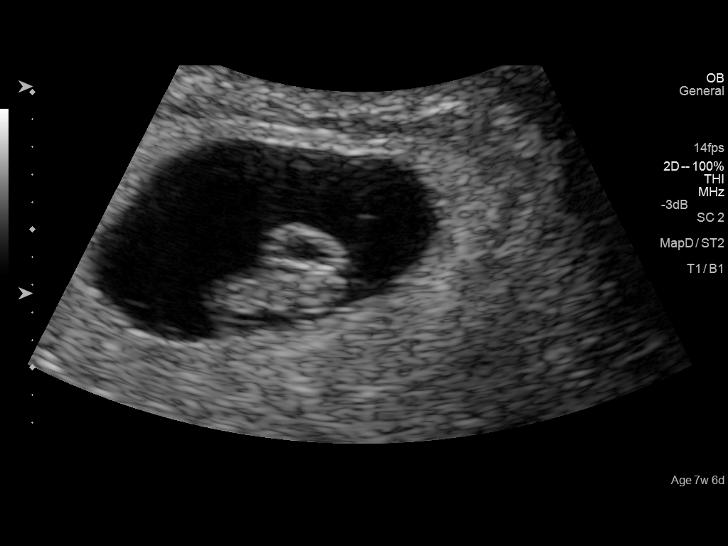

[14 of 28 positions shown; findings below may reference images not displayed]

FINDINGS: Intrauterine gestational sac: Visualized/normal in shape.

Yolk sac:  Present

Embryo:  Present

Cardiac Activity: Present

Heart Rate: 135  bpm

CRL:  8.7  mm   6 w   6 d                  US EDC: 01/30/2016

Maternal uterus/adnexae: No subchronic hemorrhage.

RIGHT ovary measures 3.3 x 2.2 x 2.7 cm and contains a 1.9 x 1.7 x 0
1.7 cm corpus luteal cyst.

LEFT ovary not visualized on either transabdominal or endovaginal
imaging question obscured by bowel.

No free pelvic fluid or adnexal masses.
IMPRESSION: Single live intrauterine gestation measured at 6 weeks 6 days EGA by
crown-rump length.

No acute abnormalities.

## 2016-07-20 ENCOUNTER — Encounter: Payer: BLUE CROSS/BLUE SHIELD | Admitting: Obstetrics and Gynecology

## 2016-09-13 DIAGNOSIS — J069 Acute upper respiratory infection, unspecified: Secondary | ICD-10-CM | POA: Diagnosis not present

## 2016-11-21 ENCOUNTER — Emergency Department
Admission: EM | Admit: 2016-11-21 | Discharge: 2016-11-21 | Disposition: A | Discharge status: Home / Self Care | Payer: BLUE CROSS/BLUE SHIELD | Attending: Emergency Medicine | Admitting: Emergency Medicine

## 2016-11-21 ENCOUNTER — Encounter: Payer: Self-pay | Admitting: Emergency Medicine

## 2016-11-21 DIAGNOSIS — R531 Weakness: Secondary | ICD-10-CM | POA: Diagnosis not present

## 2016-11-21 DIAGNOSIS — N938 Other specified abnormal uterine and vaginal bleeding: Secondary | ICD-10-CM | POA: Diagnosis not present

## 2016-11-21 DIAGNOSIS — Z79899 Other long term (current) drug therapy: Secondary | ICD-10-CM | POA: Diagnosis not present

## 2016-11-21 DIAGNOSIS — N898 Other specified noninflammatory disorders of vagina: Secondary | ICD-10-CM | POA: Insufficient documentation

## 2016-11-21 DIAGNOSIS — I10 Essential (primary) hypertension: Secondary | ICD-10-CM | POA: Insufficient documentation

## 2016-11-21 DIAGNOSIS — M545 Low back pain, unspecified: Secondary | ICD-10-CM

## 2016-11-21 DIAGNOSIS — N939 Abnormal uterine and vaginal bleeding, unspecified: Secondary | ICD-10-CM | POA: Diagnosis present

## 2016-11-21 LAB — BASIC METABOLIC PANEL
Anion gap: 5 (ref 5–15)
BUN: 13 mg/dL (ref 6–20)
CALCIUM: 8.8 mg/dL — AB (ref 8.9–10.3)
CO2: 25 mmol/L (ref 22–32)
CREATININE: 0.47 mg/dL (ref 0.44–1.00)
Chloride: 104 mmol/L (ref 101–111)
Glucose, Bld: 89 mg/dL (ref 65–99)
Potassium: 3.6 mmol/L (ref 3.5–5.1)
Sodium: 134 mmol/L — ABNORMAL LOW (ref 135–145)

## 2016-11-21 LAB — URINALYSIS, COMPLETE (UACMP) WITH MICROSCOPIC
BACTERIA UA: NONE SEEN
Bilirubin Urine: NEGATIVE
GLUCOSE, UA: NEGATIVE mg/dL
Ketones, ur: NEGATIVE mg/dL
Leukocytes, UA: NEGATIVE
NITRITE: NEGATIVE
PH: 6 (ref 5.0–8.0)
Protein, ur: 30 mg/dL — AB
Specific Gravity, Urine: 1.026 (ref 1.005–1.030)

## 2016-11-21 LAB — CBC
HCT: 31.2 % — ABNORMAL LOW (ref 35.0–47.0)
Hemoglobin: 10.5 g/dL — ABNORMAL LOW (ref 12.0–16.0)
MCH: 26.4 pg (ref 26.0–34.0)
MCHC: 33.5 g/dL (ref 32.0–36.0)
MCV: 78.7 fL — ABNORMAL LOW (ref 80.0–100.0)
PLATELETS: 364 10*3/uL (ref 150–440)
RBC: 3.96 MIL/uL (ref 3.80–5.20)
RDW: 15.9 % — AB (ref 11.5–14.5)
WBC: 5.8 10*3/uL (ref 3.6–11.0)

## 2016-11-21 LAB — TSH: TSH: 2.433 u[IU]/mL (ref 0.350–4.500)

## 2016-11-21 LAB — POCT PREGNANCY, URINE: PREG TEST UR: NEGATIVE

## 2016-11-21 LAB — HCG, QUANTITATIVE, PREGNANCY: HCG, BETA CHAIN, QUANT, S: 1 m[IU]/mL (ref <5)

## 2016-11-21 NOTE — ED Notes (Signed)
Pt reports vaginal bleeding since Saturday - she reports that she had her period last week and has started bleeding again this week - pt c/o lower back pain - denies difficulty/frequency/pain with urination

## 2016-11-21 NOTE — ED Provider Notes (Signed)
[facility] Emergency Department Provider Note  ____________________________________________  Time seen: Approximately 12:54 PM  I have reviewed the triage vital signs and the nursing notes.   HISTORY  Chief Complaint Vaginal Bleeding    HPI Victoria Gibbs is a 26 y.o. female with morbid obesity, HTN, and chronic anemia presenting with generalized weakness, vaginal bleeding, and low back pain. The patient reports that she was out of town over the weekend and on Saturday developed a dull low back pain followed by the initiation of an abnormal period.  Her LMP was 2/22, and she normally has periods only once monthly. She does use OCPs and may have missed a tablet, but is not sure. Over the last couple of days her vaginal bleeding has become more heavy but she denies any abnormal vaginal discharge.She has not had any nausea vomiting or diarrhea, abdominal pain, dysuria or urinary frequency.   Past Medical History:  Diagnosis Date  . Hypertension   . Obesity     Patient Active Problem List   Diagnosis Date Noted  . Postpartum anemia 03/23/2016  . Vitamin D deficiency 03/23/2016  . Vaginal delivery 01/24/2016  . Morbid obesity (HCC) 02/27/2013    History reviewed. No pertinent surgical history.  Current Outpatient Rx  . Order #: [id number] Class: Normal  . Order #: [id number] Class: Normal  . Order #: [id number] Class: Normal  . Order #: [id number] Class: Normal  . Order #: [id number] Class: Historical Med  . Order #: [id number] Class: Normal  . Order #: [id number] Class: Normal    Allergies Patient has no known allergies.  Family History  Problem Relation Age of Onset  . Hypertension Mother   . Diabetes Brother     Social History Social History  Substance Use Topics  . Smoking status: Never Smoker  . Smokeless tobacco: Never Used  . Alcohol use No     Comment: occasional    Review of Systems Constitutional: No fever/chills.No lightheadedness or  syncope. Denies weakness.  Eyes: No visual changes. ENT: No sore throat. No congestion or rhinorrhea. Cardiovascular: Denies chest pain. Denies palpitations. Respiratory: Denies shortness of breath.  No cough. Gastrointestinal: No abdominal pain.  No nausea, no vomiting.  No diarrhea.  No constipation. Genitourinary: Negative for dysuria. Positive vaginal bleeding. Musculoskeletal: Positive for bilateral low back pain. Skin: Negative for rash. Neurological: Negative for headaches. No focal numbness, tingling or weakness.   10-point ROS otherwise negative.  ____________________________________________   PHYSICAL EXAM:  VITAL SIGNS: ED Triage Vitals  Enc Vitals Group     BP 11/21/16 1121 130/75     Pulse Rate 11/21/16 1121 90     Resp 11/21/16 1121 18     Temp 11/21/16 1121 98.6 F (37 C)     Temp Source 11/21/16 1121 Oral     SpO2 11/21/16 1121 96 %     Weight 11/21/16 1121 295 lb (133.8 kg)     Height 11/21/16 1121 5\' 9"  (1.753 m)     Head Circumference --      Peak Flow --      Pain Score 11/21/16 1126 6     Pain Loc --      Pain Edu? --      Excl. in GC? --     Constitutional: Alert and oriented. Answers questions appropriately.Sitting comfortably and moving around comfortably. Able tablet without discomfort. Nontoxic. Eyes: Conjunctivae are normal.  EOMI. No scleral icterus. Head: Atraumatic. Nose: No congestion/rhinnorhea. Mouth/Throat: Mucous membranes are moist.  Neck: No stridor.  Supple.  No meningismus. Cardiovascular: Normal rate, regular rhythm. No murmurs, rubs or gallops.  Respiratory: Normal respiratory effort.  No accessory muscle use or retractions. Lungs CTAB.  No wheezes, rales or ronchi. Gastrointestinal: Morbidly obese. Soft, nontender and nondistended.  No guarding or rebound.  No peritoneal signs. GU: deferred as the patient is not pregnant, and has no change in vaginal discharge, no lower abdominal pain on examination Musculoskeletal: No LE  edema. mild diffuse low back pain without focality. No midline T or L-spine tenderness to palpation.  Neurologic:  A&Ox3.  Speech is clear.  Face and smile are symmetric.  EOMI.  Moves all extremities well. Skin:  Skin is warm, dry and intact. No rash noted. Psychiatric: Mood and affect are normal. Speech and behavior are normal.  Normal judgement.  ____________________________________________   LABS (all labs ordered are listed, but only abnormal results are displayed)  Labs Reviewed  BASIC METABOLIC PANEL - Abnormal; Notable for the following:       Result Value   Sodium 134 (*)    Calcium 8.8 (*)    All other components within normal limits  CBC - Abnormal; Notable for the following:    Hemoglobin 10.5 (*)    HCT 31.2 (*)    MCV 78.7 (*)    RDW 15.9 (*)    All other components within normal limits  HCG, QUANTITATIVE, PREGNANCY  URINALYSIS, COMPLETE (UACMP) WITH MICROSCOPIC  TSH  POCT PREGNANCY, URINE   ____________________________________________  EKG  Not indicated ____________________________________________  RADIOLOGY  No results found.  ____________________________________________   PROCEDURES  Procedure(s) performed: None  Procedures  Critical Care performed: No ____________________________________________   INITIAL IMPRESSION / ASSESSMENT AND PLAN / ED COURSE  Pertinent labs & imaging results that were available during my care of the patient were reviewed by me and considered in my medical decision making (see chart for details).  26 y.o. female with abnormal vaginal bleeding and low back pain, denies weakness. Overall, the patient has reassuring vital signs. She is afebrile. She is no focal findings on my examination other than some mild discomfort in the low back. Possible that she has a urinary tract infection, she she'll provide a urine sample. Her pregnancy test is negative. She is anemic but at baseline. Her electrolytes are otherwise reassuring.  Plan discharge home with or without antibiotics depending on the results of her UA. She will follow-up with her primary care physician and understands that she will need to follow up her thyroid testing. Return precautions were discussed.  ____________________________________________  FINAL CLINICAL IMPRESSION(S) / ED DIAGNOSES  Final diagnoses:  Acute bilateral low back pain without sciatica  Generalized weakness  Dysfunctional uterine bleeding         NEW MEDICATIONS STARTED DURING THIS VISIT:  New Prescriptions   No medications on file      Rockne MenghiniAnne-Caroline Jamacia Jester, MD 11/21/16 1259

## 2016-11-21 NOTE — Discharge Instructions (Signed)
Please drink plenty of fluid to stay well-hydrated. Take Tylenol or Motrin for back pain.  Return to the emergency department if you develop severe pain, fever, inability to keep down fluids, lightheadedness or fainting, or any other symptoms concerning to you.

## 2016-11-21 NOTE — ED Triage Notes (Signed)
Pt reports to ED w/ c/o vaginal bleeding. Pt sts began on Saturday, denies any chance of being pregnant. Pt c/o feeling "tired" but denies CP, SOB, dizziness, LOC or n/v/d.  Pt alert and oriented. Reports 3 tampons used in last hour.  Pt describes bleeding as dark red w/ clots.

## 2017-01-02 ENCOUNTER — Ambulatory Visit (INDEPENDENT_AMBULATORY_CARE_PROVIDER_SITE_OTHER): Payer: BLUE CROSS/BLUE SHIELD | Admitting: Certified Nurse Midwife

## 2017-01-02 ENCOUNTER — Encounter: Payer: Self-pay | Admitting: Certified Nurse Midwife

## 2017-01-02 ENCOUNTER — Encounter: Payer: BLUE CROSS/BLUE SHIELD | Admitting: Obstetrics and Gynecology

## 2017-01-02 VITALS — BP 93/66 | HR 76 | Ht 69.0 in | Wt 333.8 lb

## 2017-01-02 DIAGNOSIS — N926 Irregular menstruation, unspecified: Secondary | ICD-10-CM

## 2017-01-02 DIAGNOSIS — Z8619 Personal history of other infectious and parasitic diseases: Secondary | ICD-10-CM

## 2017-01-02 MED ORDER — NORETHINDRONE ACET-ETHINYL EST 1.5-30 MG-MCG PO TABS: 1.0000 | ORAL_TABLET | Freq: Every day | ORAL | 11 refills | Status: DC

## 2017-01-02 NOTE — Patient Instructions (Signed)
Dysfunctional Uterine Bleeding Dysfunctional uterine bleeding is abnormal bleeding from the uterus. Dysfunctional uterine bleeding includes:  A period that comes earlier or later than usual.  A period that is lighter, heavier, or has blood clots.  Bleeding between periods.  Skipping one or more periods.  Bleeding after sexual intercourse.  Bleeding after menopause. Follow these instructions at home: Pay attention to any changes in your symptoms. Follow these instructions to help with your condition: Eating and drinking   Eat well-balanced meals. Include foods that are high in iron, such as liver, meat, shellfish, green leafy vegetables, and eggs.  If you become constipated:  Drink plenty of water.  Eat fruits and vegetables that are high in water and fiber, such as spinach, carrots, raspberries, apples, and mango. Medicines   Take over-the-counter and prescription medicines only as told by your health care provider.  Do not change medicines without talking with your health care provider.  Aspirin or medicines that contain aspirin may make the bleeding worse. Do not take those medicines:  During the week before your period.  During your period.  If you were prescribed iron pills, take them as told by your health care provider. Iron pills help to replace iron that your body loses because of this condition. Activity   If you need to change your sanitary pad or tampon more than one time every 2 hours:  Lie in bed with your feet raised (elevated).  Place a cold pack on your lower abdomen.  Rest as much as possible until the bleeding stops or slows down.  Do not try to lose weight until the bleeding has stopped and your blood iron level is back to normal. Other Instructions   For two months, write down:  When your period starts.  When your period ends.  When any abnormal bleeding occurs.  What problems you notice.  Keep all follow up visits as told by your  health care provider. This is important. Contact a health care provider if:  You get light-headed or weak.  You have nausea and vomiting.  You cannot eat or drink without vomiting.  You feel dizzy or have diarrhea while you are taking medicines.  You are taking birth control pills or hormones, and you want to change them or stop taking them. Get help right away if:  You develop a fever or chills.  You need to change your sanitary pad or tampon more than one time per hour.  Your bleeding becomes heavier, or your flow contains clots more often.  You develop pain in your abdomen.  You lose consciousness.  You develop a rash. This information is not intended to replace advice given to you by your health care provider. Make sure you discuss any questions you have with your health care provider. Document Released: 09/02/2000 Document Revised: 02/11/2016 Document Reviewed: 12/01/2014 Elsevier Interactive Patient Education  2017 Elsevier Inc.  

## 2017-01-02 NOTE — Progress Notes (Signed)
GYN ENCOUNTER NOTE  Subjective:       Victoria Gibbs is a 26 y.o. G62P1001 female is here for gynecologic evaluation of the following issues:  1. Irregular menses. She has stopped breastfeeding her son and is now have menses every other week.     Gynecologic History No LMP recorded. LMP 12/31/16 Contraception: oral progesterone-only contraceptive    Obstetric History OB History  Gravida Para Term Preterm AB Living  SAB TAB Ectopic Multiple Live Births        0 1    # Outcome Date GA Lbr Len/2nd Weight Sex Delivery Anes PTL Lv  1 Term 01/23/16 [redacted]w[redacted]d 09:22 / 01:48 7 lb 8.3 oz (3.41 kg) M Vag-Forceps EPI  LIV     Birth Comments: couple of tiny nevus on right rib-cage area      Past Medical History:  Diagnosis Date  . Hypertension   . Obesity     No past surgical history on file.  Current Outpatient Prescriptions on File Prior to Visit  Medication Sig Dispense Refill  . docusate sodium (COLACE) 100 MG capsule Take 1 capsule (100 mg total) by mouth 2 (two) times daily. (Patient not taking: Reported on 03/18/2016) 10 capsule 0  . ibuprofen (ADVIL,MOTRIN) 800 MG tablet Take 1 tablet (800 mg total) by mouth 3 (three) times daily. (Patient not taking: Reported on 03/18/2016) 30 tablet 0  . Iron-FA-B Cmp-C-Biot-Probiotic (FUSION PLUS) CAPS Take 1 capsule by mouth daily. 60 capsule 1  . norethindrone (MICRONOR,CAMILA,ERRIN) 0.35 MG tablet Take 1 tablet (0.35 mg total) by mouth daily. 1 Package 11  . Prenatal Vit-Fe Fumarate-FA (PRENATAL MULTIVITAMIN) TABS tablet Take 1 tablet by mouth daily at 12 noon. Reported on 03/18/2016    . tinidazole (TINDAMAX) 500 MG tablet Take 4 tablets (2,000 mg total) by mouth daily with breakfast. For two days 8 tablet 2  . Vitamin D, Ergocalciferol, (DRISDOL) 50000 units CAPS capsule Take 1 capsule (50,000 Units total) by mouth 2 (two) times a week. 30 capsule 1   No current facility-administered medications on file prior to visit.     No  Known Allergies  Social History   Social History  . Marital status: Single    Spouse name: N/A  . Number of children: N/A  . Years of education: N/A   Occupational History  . Not on file.   Social History Main Topics  . Smoking status: Never Smoker  . Smokeless tobacco: Never Used  . Alcohol use No     Comment: occasional  . Drug use: No  . Sexual activity: Not Currently    Birth control/ protection: None   Other Topics Concern  . Not on file   Social History Narrative  . No narrative on file    Family History  Problem Relation Age of Onset  . Hypertension Mother   . Diabetes Brother     The following portions of the patient's history were reviewed and updated as appropriate: allergies, current medications, past family history, past medical history, past social history, past surgical history and problem list.  Review of Systems Review of Systems - Negative except What is mentioned in HPI Review of Systems - General ROS: negative for - chills, fatigue, fever, hot flashes, malaise or night sweats Hematological and Lymphatic ROS: negative for - bleeding problems or swollen lymph nodes Gastrointestinal ROS: negative for - abdominal pain, blood in stools, change in bowel habits and nausea/vomiting Musculoskeletal  ROS: negative for - joint pain, muscle pain or muscular weakness Genito-Urinary ROS: negative for - change in menstrual cycle, dysmenorrhea, dyspareunia, dysuria, genital discharge, genital ulcers, hematuria, incontinence  Objective:   There were no vitals taken for this visit. CONSTITUTIONAL: Well-developed, well-nourished female in no acute distress.  HENT:  Normocephalic, atraumatic.  NEUROLGIC: Alert and oriented to person, place, and time PSYCHIATRIC: Normal mood and affect. Normal behavior. Normal judgment and thought content. CARDIOVASCULAR:Not Examined RESPIRATORY: Not Examined BREASTS: Not Examined Abdomen: not examined PELVIC:Not  examined MUSCULOSKELETAL: Normal range of motion.    Assessment:   Irregular menses   Plan:   Changed birth control to combined pill. Test of cure today for positive trichomonas at postpartum visit. Discussed diet and exercise for weight loss to improve cycles. To follow up as needed or if symptoms do not improve.   [name]

## 2017-01-02 NOTE — Addendum Note (Signed)
Addended by: Marchelle Folks on: 01/02/2017 10:45 AM   Modules accepted: Orders

## 2017-01-06 LAB — NUSWAB VAGINITIS PLUS (VG+)
ATOPOBIUM VAGINAE: HIGH {score} — AB
BVAB 2: HIGH Score — AB
CANDIDA ALBICANS, NAA: NEGATIVE
Candida glabrata, NAA: NEGATIVE
Chlamydia trachomatis, NAA: NEGATIVE
Megasphaera 1: HIGH Score — AB
NEISSERIA GONORRHOEAE, NAA: NEGATIVE
Trich vag by NAA: NEGATIVE

## 2017-01-09 ENCOUNTER — Other Ambulatory Visit: Payer: Self-pay | Admitting: Certified Nurse Midwife

## 2017-01-09 ENCOUNTER — Encounter: Payer: Self-pay | Admitting: Certified Nurse Midwife

## 2017-01-09 MED ORDER — METRONIDAZOLE 500 MG PO TABS: 500.0000 mg | ORAL_TABLET | Freq: Two times a day (BID) | ORAL | 0 refills | Status: AC

## 2017-01-09 NOTE — Progress Notes (Signed)
Nuswab showed BV. Prescription for flagyl sent to pharmacy.   [name], CNM

## 2017-02-02 ENCOUNTER — Other Ambulatory Visit: Payer: Self-pay | Admitting: Obstetrics and Gynecology

## 2017-03-30 ENCOUNTER — Telehealth: Payer: Self-pay | Admitting: Obstetrics and Gynecology

## 2017-04-06 ENCOUNTER — Encounter: Payer: Self-pay | Admitting: Obstetrics and Gynecology

## 2017-04-06 ENCOUNTER — Ambulatory Visit (INDEPENDENT_AMBULATORY_CARE_PROVIDER_SITE_OTHER): Payer: BLUE CROSS/BLUE SHIELD | Admitting: Obstetrics and Gynecology

## 2017-04-06 VITALS — BP 121/77 | HR 88 | Ht 69.0 in | Wt 328.9 lb

## 2017-04-06 DIAGNOSIS — L732 Hidradenitis suppurativa: Secondary | ICD-10-CM | POA: Diagnosis not present

## 2017-04-06 MED ORDER — CLINDAMYCIN PHOSPHATE 1 % EX GEL: Freq: Two times a day (BID) | CUTANEOUS | 11 refills | Status: DC

## 2017-04-06 NOTE — Progress Notes (Signed)
    GYNECOLOGY PROGRESS NOTE  Subjective:    Patient ID: Victoria Gibbs, female    DOB: 05-06-1991, 26 y.o.   MRN: [id number]  HPI  Patient is a 26 y.o. 611P1001 female who presents for complaints of worsening skin disorder (hydradenitis).  Patient states that she has been dealing with this since age 26.  Was told by her pediatrician to just "watch it", and that it may improve. She notes that it is now getting worse, and that she has problems shaving due to all of the lesions present, and that shaving causes them to become worse.  She is washing with an antibiotic soap.  Has not tried any other treatments.   The following portions of the patient's history were reviewed and updated as appropriate: allergies, current medications, past family history, past medical history, past social history, past surgical history and problem list.  Review of Systems Pertinent items noted in HPI and remainder of comprehensive ROS otherwise negative.   Objective:   Blood pressure 121/77, pulse 88, height 5\' 9"  (1.753 m), weight (!) 328 lb 14.4 oz (149.2 kg), last menstrual period 03/27/2017, not currently breastfeeding. Body mass index is 48.57 kg/m.  General appearance: alert and no distress Skin: multiple pimple-like lesions and nodules beneath skin in axillary region, and beneath pannus going down to groin region.   Assessment:   Hidradenitis Morbid obesity  Plan:   Discussion had on management for hydradenitis.  As patient has never attempted any treatments, will start with topical antibiotics.  Prescribed Clindamycin gel, to use BID.  Also discussed appropriate cleaning of shaving tools, alternative methods of hair removal, and aftershave treatments.  To continue use of antibiotic soap.  Patient also taking hormonal birth control which has also been shown to help.  Will f/u with patient in 2 months to reassess symptoms. To use Clindamycin for at least 3 months. If this does not work, can consider use  of oral antibiotics or retinoids, as well as oral or injected steroids. Also may need referral to Dermatologist if non-responsive to treatments.    Hildred Laserherry, Dashawn Golda, MD [facility]

## 2017-04-24 ENCOUNTER — Ambulatory Visit: Payer: Self-pay | Admitting: Family Medicine

## 2017-04-25 ENCOUNTER — Ambulatory Visit: Payer: BLUE CROSS/BLUE SHIELD | Admitting: Family Medicine

## 2017-06-07 ENCOUNTER — Ambulatory Visit (INDEPENDENT_AMBULATORY_CARE_PROVIDER_SITE_OTHER): Payer: BLUE CROSS/BLUE SHIELD | Admitting: Obstetrics and Gynecology

## 2017-06-07 VITALS — BP 117/73 | HR 81 | Ht 69.0 in | Wt 324.7 lb

## 2017-06-07 DIAGNOSIS — L732 Hidradenitis suppurativa: Secondary | ICD-10-CM | POA: Diagnosis not present

## 2017-06-07 MED ORDER — HYDROCORTISONE 1 % EX OINT: 1.0000 "application " | TOPICAL_OINTMENT | Freq: Two times a day (BID) | CUTANEOUS | 0 refills | Status: DC

## 2017-06-07 MED ORDER — TETRACYCLINE HCL 500 MG PO CAPS: 500.0000 mg | ORAL_CAPSULE | Freq: Two times a day (BID) | ORAL | 4 refills | Status: DC

## 2017-06-07 NOTE — Progress Notes (Signed)
    GYNECOLOGY PROGRESS NOTE  Subjective:    Patient ID: Victoria Gibbs, female    DOB: [birth year], 26 y.o.   MRN: [id number]  HPI  Patient is a 26 y.o. G102P1001 female who presents for 2 month f/u of hydradenitis.  Was prescribed topical clindamycin gel last visit.  Patient notes that she has not noticed any improvement in her symptoms.    The following portions of the patient's history were reviewed and updated as appropriate: allergies, current medications, past family history, past medical history, past social history, past surgical history and problem list.  Review of Systems Pertinent items noted in HPI and remainder of comprehensive ROS otherwise negative.   Objective:   Blood pressure 117/73, pulse 81, height  (1.753 m), weight (!) 324 lb 11.2 oz (147.3 kg), last menstrual period 05/29/2017, not currently breastfeeding. General appearance: alert and no distress Skin: multiple pimple-like lesions and nodules beneath skin in axillary region, and beneath pannus going down to groin region.    Assessment:   Hydradenitis Morbid obesity  Plan:   - Will change from topical to oral therapy.  Will prescribe tetracycline 500 mg BID to take for a total of 4 months.  Advised to be very diligent with her contraception as this medication can be teratogenic to a fetus.   Will also prescribe topical steroid cream to apply for 1 month to affected areas.  - Discussed that weight management can also help patient in dealing with some of her skin issues.  - To f/u in 2 months to reassess symptoms.    Hildred Laser, MD [facility]

## 2017-06-08 ENCOUNTER — Telehealth: Payer: Self-pay | Admitting: Obstetrics and Gynecology

## 2017-06-08 DIAGNOSIS — L732 Hidradenitis suppurativa: Secondary | ICD-10-CM

## 2017-06-08 NOTE — Telephone Encounter (Signed)
Patient can be called in doxycycline 100 mg BID for 30 days with 3 refills.

## 2017-06-08 NOTE — Telephone Encounter (Signed)
Patient called stating she cannot afford the tetracycline. She stated it is 579.00. Is there something else she can be prescribed. Thanks

## 2017-06-09 MED ORDER — DOXYCYCLINE HYCLATE 100 MG PO TABS: 100.0000 mg | ORAL_TABLET | Freq: Two times a day (BID) | ORAL | 3 refills | Status: DC

## 2017-06-09 NOTE — Telephone Encounter (Signed)
Called pt informed her that new medication had been sent in that should be more cost effective.

## 2017-07-17 ENCOUNTER — Telehealth: Payer: Self-pay | Admitting: Obstetrics and Gynecology

## 2017-07-17 MED ORDER — HYDROCORTISONE 1 % EX OINT: 1.0000 "application " | TOPICAL_OINTMENT | Freq: Two times a day (BID) | CUTANEOUS | 0 refills | Status: DC

## 2017-07-17 NOTE — Telephone Encounter (Signed)
Received paper refill request for pt's hydrocortisone. Pt does have a upcoming follow up appt in Nov. [facility]

## 2017-08-08 ENCOUNTER — Ambulatory Visit (INDEPENDENT_AMBULATORY_CARE_PROVIDER_SITE_OTHER): Payer: BLUE CROSS/BLUE SHIELD | Admitting: Obstetrics and Gynecology

## 2017-08-08 ENCOUNTER — Encounter: Payer: Self-pay | Admitting: Obstetrics and Gynecology

## 2017-08-08 VITALS — BP 135/72 | HR 88 | Ht 69.0 in | Wt 328.8 lb

## 2017-08-08 DIAGNOSIS — L732 Hidradenitis suppurativa: Secondary | ICD-10-CM

## 2017-08-08 MED ORDER — ACITRETIN 10 MG PO CAPS: 10.0000 mg | ORAL_CAPSULE | Freq: Every day | ORAL | 3 refills | Status: DC

## 2017-08-08 NOTE — Progress Notes (Signed)
    GYNECOLOGY PROGRESS NOTE  Subjective:    Patient ID: Victoria Gibbs, female    DOB: 12/18[name]/1992, 26 y.o.   MRN: [id number]  HPI  Patient is a 26 y.o. 621P1001 female who presents for 2 month f/u of hydradenitis.  Was hydrocortisone ointment and Doxycycline last visit (initially was prescribed Tetracyclin but was noted to be too expensive).    Patient notes that she has not noticed much improvement in her symptoms, except does note that itching has decreased with use of the hydrocortisone.    The following portions of the patient's history were reviewed and updated as appropriate: allergies, current medications, past family history, past medical history, past social history, past surgical history and problem list.  Review of Systems Pertinent items noted in HPI and remainder of comprehensive ROS otherwise negative.   Objective:   Blood pressure 135/72, pulse 88, height 5\' 9"  (1.753 m), weight (!) 328 lb 12.8 oz (149.1 kg), last menstrual period 07/10/2017, not currently breastfeeding. Body mass index is 48.56 kg/m.  General appearance: alert and no distress Skin: striations and thickened darkened folds of axillary skin with pustular lesion under left axilla.  Also areas present in groin and thigh area.   Assessment:   Hydradenitis Morbid obesity  Plan:   - Will change from Doxycyline to Soriatane.  She will take this for a total of 3-6 months.   Advised to be very diligent with her contraception as this medication can be teratogenic to a fetus. Also noted that it can decrease the efficacy of her contraception, so would recommend a back up method (such as condoms) while using the medication.  To continue Hydrocortisone ointment as needed.   - Referral to Dermatology placed.  To f/u in 2-3 months at [facility] if she has not yet been seen by Dermatology.    Hildred Laserherry, Aulton Routt, MD [facility]

## 2017-12-07 NOTE — Telephone Encounter (Signed)
Error

## 2017-12-08 ENCOUNTER — Telehealth: Payer: Self-pay | Admitting: Obstetrics and Gynecology

## 2017-12-08 ENCOUNTER — Other Ambulatory Visit: Payer: Self-pay

## 2017-12-08 MED ORDER — NORETHINDRONE ACET-ETHINYL EST 1.5-30 MG-MCG PO TABS: 1.0000 | ORAL_TABLET | Freq: Every day | ORAL | 1 refills | Status: DC

## 2017-12-08 NOTE — Telephone Encounter (Signed)
The patient called and stated that she is not able to get her B.C without authorization from her provider. The patient would like that filled and a call back from her nurse. No other information was disclosed. Please advise.

## 2017-12-08 NOTE — Telephone Encounter (Signed)
Pt called informed her that she needed to come in for an annual and her she would receive 1 refill of bc until she can make it to her appt.

## 2017-12-16 DIAGNOSIS — R112 Nausea with vomiting, unspecified: Secondary | ICD-10-CM | POA: Diagnosis not present

## 2017-12-16 DIAGNOSIS — R8290 Unspecified abnormal findings in urine: Secondary | ICD-10-CM | POA: Diagnosis not present

## 2017-12-16 DIAGNOSIS — R197 Diarrhea, unspecified: Secondary | ICD-10-CM | POA: Diagnosis not present

## 2017-12-18 ENCOUNTER — Encounter: Payer: Self-pay | Admitting: Emergency Medicine

## 2017-12-18 ENCOUNTER — Other Ambulatory Visit: Payer: Self-pay

## 2017-12-18 ENCOUNTER — Emergency Department
Admission: EM | Admit: 2017-12-18 | Discharge: 2017-12-18 | Disposition: A | Discharge status: Home / Self Care | Payer: BLUE CROSS/BLUE SHIELD | Attending: Student in an Organized Health Care Education/Training Program | Admitting: Student in an Organized Health Care Education/Training Program

## 2017-12-18 ENCOUNTER — Emergency Department: Payer: BLUE CROSS/BLUE SHIELD

## 2017-12-18 DIAGNOSIS — I10 Essential (primary) hypertension: Secondary | ICD-10-CM | POA: Diagnosis not present

## 2017-12-18 DIAGNOSIS — R110 Nausea: Secondary | ICD-10-CM | POA: Diagnosis not present

## 2017-12-18 DIAGNOSIS — Z3202 Encounter for pregnancy test, result negative: Secondary | ICD-10-CM | POA: Diagnosis not present

## 2017-12-18 DIAGNOSIS — R112 Nausea with vomiting, unspecified: Secondary | ICD-10-CM | POA: Diagnosis not present

## 2017-12-18 DIAGNOSIS — R1010 Upper abdominal pain, unspecified: Secondary | ICD-10-CM | POA: Diagnosis not present

## 2017-12-18 DIAGNOSIS — Z79899 Other long term (current) drug therapy: Secondary | ICD-10-CM | POA: Diagnosis not present

## 2017-12-18 DIAGNOSIS — R1011 Right upper quadrant pain: Secondary | ICD-10-CM | POA: Insufficient documentation

## 2017-12-18 DIAGNOSIS — R109 Unspecified abdominal pain: Secondary | ICD-10-CM | POA: Diagnosis not present

## 2017-12-18 LAB — COMPREHENSIVE METABOLIC PANEL
ALBUMIN: 3.6 g/dL (ref 3.5–5.0)
ALT: 20 U/L (ref 14–54)
AST: 18 U/L (ref 15–41)
Alkaline Phosphatase: 59 U/L (ref 38–126)
Anion gap: 7 (ref 5–15)
BILIRUBIN TOTAL: 0.3 mg/dL (ref 0.3–1.2)
BUN: 9 mg/dL (ref 6–20)
CHLORIDE: 103 mmol/L (ref 101–111)
CO2: 26 mmol/L (ref 22–32)
Calcium: 8.8 mg/dL — ABNORMAL LOW (ref 8.9–10.3)
Creatinine, Ser: 0.91 mg/dL (ref 0.44–1.00)
GFR calc Af Amer: >60 mL/min (ref >60)
GFR calc non Af Amer: >60 mL/min (ref >60)
GLUCOSE: 80 mg/dL (ref 65–99)
Potassium: 3.7 mmol/L (ref 3.5–5.1)
Sodium: 136 mmol/L (ref 135–145)
Total Protein: 8.4 g/dL — ABNORMAL HIGH (ref 6.5–8.1)

## 2017-12-18 LAB — URINALYSIS, COMPLETE (UACMP) WITH MICROSCOPIC
BACTERIA UA: NONE SEEN
Bilirubin Urine: NEGATIVE
Glucose, UA: NEGATIVE mg/dL
Ketones, ur: NEGATIVE mg/dL
Nitrite: NEGATIVE
Protein, ur: 30 mg/dL — AB
Specific Gravity, Urine: 1.027 (ref 1.005–1.030)
pH: 6 (ref 5.0–8.0)

## 2017-12-18 LAB — CBC
HEMATOCRIT: 35.9 % (ref 35.0–47.0)
Hemoglobin: 12 g/dL (ref 12.0–16.0)
MCH: 28.4 pg (ref 26.0–34.0)
MCHC: 33.4 g/dL (ref 32.0–36.0)
MCV: 85.2 fL (ref 80.0–100.0)
Platelets: 310 10*3/uL (ref 150–440)
RBC: 4.21 MIL/uL (ref 3.80–5.20)
RDW: 14.1 % (ref 11.5–14.5)
WBC: 4.4 10*3/uL (ref 3.6–11.0)

## 2017-12-18 LAB — LIPASE, BLOOD: LIPASE: 18 U/L (ref 11–51)

## 2017-12-18 LAB — POCT PREGNANCY, URINE: PREG TEST UR: NEGATIVE

## 2017-12-18 MED ORDER — PROCHLORPERAZINE MALEATE 10 MG PO TABS: 10.0000 mg | ORAL_TABLET | Freq: Four times a day (QID) | ORAL | 0 refills | Status: DC | PRN

## 2017-12-18 MED ORDER — DICYCLOMINE HCL 10 MG PO CAPS: 10.0000 mg | ORAL_CAPSULE | Freq: Three times a day (TID) | ORAL | 0 refills | Status: DC | PRN

## 2017-12-18 NOTE — ED Triage Notes (Signed)
[facility].  Patient C/O mid abdominal pain, worse after eating.  Symptoms have been onset 2 days.  Patient AAOx3.  Skin warm and dry. NAD

## 2017-12-18 NOTE — ED Notes (Addendum)
Pt reports upper middle and upper right quadrants since Saturday. Pt states she has been nauseous most of that time and has vomited x2 in last 24 hours. Pt denies eating any different foods prior to pain starting. Pain relieved by changing positions. Pt tender to palpation on upper right and upper middle quadrants. Pt not currently nauseous.

## 2017-12-18 NOTE — ED Notes (Signed)
ED Provider at bedside. 

## 2017-12-18 NOTE — Discharge Instructions (Signed)

## 2017-12-18 NOTE — ED Provider Notes (Signed)
[facility] Emergency Department Provider Note    First MD Initiated Contact with Patient 12/18/17 1414     (approximate)  I have reviewed the triage vital signs and the nursing notes.   HISTORY  Chief Complaint Abdominal Pain    HPI MAAYAN JENNING is a 27 y.o. female no significant past medical history presents with chief complaint of 3 days of worsening right upper quadrant abdominal pain associated with nausea vomiting and decreased oral intake.  Denies any fevers.  States that she initially went to urgent care on Saturday and was recommended to have an ultrasound but also told it could be viral so she decided to wait.  Since then any time she she has been having sudden onset right upper quadrant abdominal pain associated with nausea and vomiting.  No recent sick contacts.  No one else at home sick.  Denies any dysuria, vaginal discharge.  Did have one episode of diarrhea on Saturday.  Past Medical History:  Diagnosis Date  . Hidradenitis suppurativa   . Hypertension   . Obesity    Family History  Problem Relation Age of Onset  . Hypertension Mother   . Diabetes Brother   . Breast cancer Neg Hx   . Ovarian cancer Neg Hx   . Colon cancer Neg Hx    History reviewed. No pertinent surgical history. Patient Active Problem List   Diagnosis Date Noted  . Vitamin D deficiency 03/23/2016  . Irregular menses 04/21/2014  . Morbid obesity (HCC) 02/27/2013      Prior to Admission medications   Medication Sig Start Date End Date Taking? Authorizing Provider  acitretin (SORIATANE) 10 MG capsule Take 1 capsule (10 mg total) by mouth daily before breakfast. 08/08/17   Hildred Laser, MD  clindamycin (CLINDAGEL) 1 % gel Apply topically 2 (two) times daily. Patient not taking: Reported on 08/08/2017 04/06/17   Hildred Laser, MD  dicyclomine (BENTYL) 10 MG capsule Take 1 capsule (10 mg total) by mouth 3 (three) times daily as needed for up to 14 days for  spasms. 12/18/17 01/01/18  Willy Eddy, MD  hydrocortisone 1 % ointment Apply 1 application topically 2 (two) times daily. Use x 3-4 weeks Patient not taking: Reported on 08/08/2017 07/17/17   Hildred Laser, MD  Norethindrone Acetate-Ethinyl Estradiol (JUNEL,LOESTRIN,MICROGESTIN) 1.5-30 MG-MCG tablet Take 1 tablet by mouth daily. 12/08/17   Hildred Laser, MD  prochlorperazine (COMPAZINE) 10 MG tablet Take 1 tablet (10 mg total) by mouth every 6 (six) hours as needed for nausea or vomiting. 12/18/17   Willy Eddy, MD  tetracycline (ACHROMYCIN,SUMYCIN) 500 MG capsule Take 1 capsule (500 mg total) by mouth 2 (two) times daily. Patient not taking: Reported on 08/08/2017 06/07/17   Hildred Laser, MD    Allergies Patient has no known allergies.    Social History Social History   Tobacco Use  . Smoking status: Never Smoker  . Smokeless tobacco: Never Used  Substance Use Topics  . Alcohol use: No    Comment: occasional  . Drug use: No    Review of Systems Patient denies headaches, rhinorrhea, blurry vision, numbness, shortness of breath, chest pain, edema, cough, abdominal pain, nausea, vomiting, diarrhea, dysuria, fevers, rashes or hallucinations unless otherwise stated above in HPI. ____________________________________________   PHYSICAL EXAM:  VITAL SIGNS: Vitals:   12/18/17 1415 12/18/17 1624  BP: 118/71 125/73  Pulse: 62 69  Resp: 16 18  Temp:    SpO2: 100% 100%    Constitutional: Alert and  oriented. Well appearing and in no acute distress. Eyes: Conjunctivae are normal.  Head: Atraumatic. Nose: No congestion/rhinnorhea. Mouth/Throat: Mucous membranes are moist.   Neck: No stridor. Painless ROM.  Cardiovascular: Normal rate, regular rhythm. Grossly normal heart sounds.  Good peripheral circulation. Respiratory: Normal respiratory effort.  No retractions. Lungs CTAB. Gastrointestinal: Soft with some mild ttp in epigastrium and ruq. No distention. No abdominal  bruits. No CVA tenderness. Genitourinary: deferred Musculoskeletal: No lower extremity tenderness nor edema.  No joint effusions. Neurologic:  Normal speech and language. No gross focal neurologic deficits are appreciated. No facial droop Skin:  Skin is warm, dry and intact. No rash noted. Psychiatric: Mood and affect are normal. Speech and behavior are normal.  ____________________________________________   LABS (all labs ordered are listed, but only abnormal results are displayed)  Results for orders placed or performed during the hospital encounter of 12/18/17 (from the past 24 hour(s))  Lipase, blood     Status: None   Collection Time: 12/18/17 12:29 PM  Result Value Ref Range   Lipase 18 11 - 51 U/L  Comprehensive metabolic panel     Status: Abnormal   Collection Time: 12/18/17 12:29 PM  Result Value Ref Range   Sodium 136 135 - 145 mmol/L   Potassium 3.7 3.5 - 5.1 mmol/L   Chloride 103 101 - 111 mmol/L   CO2 26 22 - 32 mmol/L   Glucose, Bld 80 65 - 99 mg/dL   BUN 9 6 - 20 mg/dL   Creatinine, Ser 1.610.91 0.44 - 1.00 mg/dL   Calcium 8.8 (L) 8.9 - 10.3 mg/dL   Total Protein 8.4 (H) 6.5 - 8.1 g/dL   Albumin 3.6 3.5 - 5.0 g/dL   AST 18 15 - 41 U/L   ALT 20 14 - 54 U/L   Alkaline Phosphatase 59 38 - 126 U/L   Total Bilirubin 0.3 0.3 - 1.2 mg/dL   GFR calc non Af Amer >60 >60 mL/min   GFR calc Af Amer >60 >60 mL/min   Anion gap 7 5 - 15  CBC     Status: None   Collection Time: 12/18/17 12:29 PM  Result Value Ref Range   WBC 4.4 3.6 - 11.0 K/uL   RBC 4.21 3.80 - 5.20 MIL/uL   Hemoglobin 12.0 12.0 - 16.0 g/dL   HCT 09.635.9 04.535.0 - 40.947.0 %   MCV 85.2 80.0 - 100.0 fL   MCH 28.4 26.0 - 34.0 pg   MCHC 33.4 32.0 - 36.0 g/dL   RDW 81.114.1 91.411.5 - 78.214.5 %   Platelets 310 150 - 440 K/uL  Urinalysis, Complete w Microscopic     Status: Abnormal   Collection Time: 12/18/17 12:29 PM  Result Value Ref Range   Color, Urine AMBER (A) YELLOW   APPearance CLOUDY (A) CLEAR   Specific Gravity,  Urine 1.027 1.005 - 1.030   pH 6.0 5.0 - 8.0   Glucose, UA NEGATIVE NEGATIVE mg/dL   Hgb urine dipstick LARGE (A) NEGATIVE   Bilirubin Urine NEGATIVE NEGATIVE   Ketones, ur NEGATIVE NEGATIVE mg/dL   Protein, ur 30 (A) NEGATIVE mg/dL   Nitrite NEGATIVE NEGATIVE   Leukocytes, UA TRACE (A) NEGATIVE   RBC / HPF 6-30 0 - 5 RBC/hpf   WBC, UA 6-30 0 - 5 WBC/hpf   Bacteria, UA NONE SEEN NONE SEEN   Squamous Epithelial / LPF 6-30 (A) NONE SEEN   Mucus PRESENT   Pregnancy, urine POC     Status: None  Collection Time: 12/18/17 12:52 PM  Result Value Ref Range   Preg Test, Ur NEGATIVE NEGATIVE   ____________________________________________ ____________________________________________  RADIOLOGY  I personally reviewed all radiographic images ordered to evaluate for the above acute complaints and reviewed radiology reports and findings.  These findings were personally discussed with the patient.  Please see medical record for radiology report.  ____________________________________________   PROCEDURES  Procedure(s) performed:  Procedures    Critical Care performed: no ____________________________________________   INITIAL IMPRESSION / ASSESSMENT AND PLAN / ED COURSE  Pertinent labs & imaging results that were available during my care of the patient were reviewed by me and considered in my medical decision making (see chart for details).  DDX: Cholelithiasis, cholecystitis, pancreatitis, enteritis, gastritis, Arna Medici virus  TALLEY CASCO is a 27 y.o. who presents to the ED with symptoms as described above.  Blood work is reassuring.  Ultrasound ordered for the above differential shows no evidence of stone or cholecystitis.  Chest x-ray shows no acute abnormality.  Patient tolerating oral hydration.  May have component of biliary colic.  Certainly no abdominal pain that I feel indicates need for emergent CT imaging at this time.  I do believe that she is tolerating oral hydration that  she is stable and appropriate for trial of outpatient management.  Have discussed with the patient and available family all diagnostics and treatments performed thus far and all questions were answered to the best of my ability. The patient demonstrates understanding and agreement with plan.       As part of my medical decision making, I reviewed the following data within the electronic MEDICAL RECORD NUMBER Nursing notes reviewed and incorporated, Labs reviewed, notes from prior ED visits.   ____________________________________________   FINAL CLINICAL IMPRESSION(S) / ED DIAGNOSES  Final diagnoses:  RUQ abdominal pain  Nausea      NEW MEDICATIONS STARTED DURING THIS VISIT:  New Prescriptions   DICYCLOMINE (BENTYL) 10 MG CAPSULE    Take 1 capsule (10 mg total) by mouth 3 (three) times daily as needed for up to 14 days for spasms.   PROCHLORPERAZINE (COMPAZINE) 10 MG TABLET    Take 1 tablet (10 mg total) by mouth every 6 (six) hours as needed for nausea or vomiting.     Note:  This document was prepared using Dragon voice recognition software and may include unintentional dictation errors.    Willy Eddy, MD 12/18/17 [phone]

## 2018-01-03 ENCOUNTER — Encounter: Payer: BLUE CROSS/BLUE SHIELD | Admitting: Obstetrics and Gynecology

## 2018-01-24 ENCOUNTER — Encounter: Payer: BLUE CROSS/BLUE SHIELD | Admitting: Obstetrics and Gynecology

## 2018-02-19 ENCOUNTER — Other Ambulatory Visit: Payer: Self-pay

## 2018-02-19 ENCOUNTER — Encounter: Payer: Self-pay | Admitting: Obstetrics and Gynecology

## 2018-02-19 ENCOUNTER — Ambulatory Visit: Payer: Self-pay | Admitting: Obstetrics and Gynecology

## 2018-02-19 VITALS — BP 111/72 | HR 85 | Ht 69.0 in | Wt 334.1 lb

## 2018-02-19 DIAGNOSIS — Z30011 Encounter for initial prescription of contraceptive pills: Secondary | ICD-10-CM

## 2018-02-19 DIAGNOSIS — Z01419 Encounter for gynecological examination (general) (routine) without abnormal findings: Secondary | ICD-10-CM

## 2018-02-19 DIAGNOSIS — Z124 Encounter for screening for malignant neoplasm of cervix: Secondary | ICD-10-CM

## 2018-02-19 DIAGNOSIS — I10 Essential (primary) hypertension: Secondary | ICD-10-CM

## 2018-02-19 DIAGNOSIS — L732 Hidradenitis suppurativa: Secondary | ICD-10-CM

## 2018-02-19 MED ORDER — NORETHINDRONE ACET-ETHINYL EST 1.5-30 MG-MCG PO TABS: 1.0000 | ORAL_TABLET | Freq: Every day | ORAL | 3 refills | Status: DC

## 2018-02-19 NOTE — Progress Notes (Signed)
GYNECOLOGY ANNUAL PHYSICAL EXAM PROGRESS NOTE  Subjective:    Victoria Gibbs is a 27 y.o. 301P1001 female who presents for an annual exam. The patient has no complaints today. The patient has just recently resumed being sexually active after a period of abstinence. The patient wears seatbelts: yes. The patient participates in regular exercise: no. Has the patient ever been transfused or tattooed?: no. The patient reports that there is not domestic violence in her life.    Gynecologic History Menarche age: [age] Patient's last menstrual period was 01/09/2018. Contraception: abstinence.  Notes that she had not taken her OCPs in the past several months, but now wishes to resume as she has a new partner.  History of STI's: Denies Last Pap: 04/2014. Results were: normal.  Denies h/o abnormal pap smears.   OB History  Gravida Para Term Preterm AB Living  1 1 1  0 0 1  SAB TAB Ectopic Multiple Live Births  0 0 0 0 1    # Outcome Date GA Lbr Len/2nd Weight Sex Delivery Anes PTL Lv  1 Term 01/23/16 [id number] 09:22 / 01:48 7 lb 8.3 oz (3.41 kg) M Vag-Forceps EPI  LIV     Birth Comments: couple of tiny nevus on right rib-cage area     Name: Rushlow,BOY Joell     Apgar1: 8  Apgar5: 9    Past Medical History:  Diagnosis Date  . Hidradenitis suppurativa   . Hypertension   . Obesity     History reviewed. No pertinent surgical history.  Family History  Problem Relation Age of Onset  . Hypertension Mother   . Diabetes Brother   . Breast cancer Neg Hx   . Ovarian cancer Neg Hx   . Colon cancer Neg Hx     Social History   Socioeconomic History  . Marital status: Single    Spouse name: Not on file  . Number of children: Not on file  . Years of education: Not on file  . Highest education level: Not on file  Occupational History  . Not on file  Social Needs  . Financial resource strain: Not on file  . Food insecurity:    Worry: Not on file    Inability: Not on file  .  Transportation needs:    Medical: Not on file    Non-medical: Not on file  Tobacco Use  . Smoking status: Never Smoker  . Smokeless tobacco: Never Used  Substance and Sexual Activity  . Alcohol use: No    Comment: occasional  . Drug use: No  . Sexual activity: Yes    Birth control/protection: Pill  Lifestyle  . Physical activity:    Days per week: Not on file    Minutes per session: Not on file  . Stress: Not on file  Relationships  . Social connections:    Talks on phone: Not on file    Gets together: Not on file    Attends religious service: Not on file    Active member of club or organization: Not on file    Attends meetings of clubs or organizations: Not on file    Relationship status: Not on file  . Intimate partner violence:    Fear of current or ex partner: Not on file    Emotionally abused: Not on file    Physically abused: Not on file    Forced sexual activity: Not on file  Other Topics Concern  . Not on file  Social  History Narrative  . Not on file    Current Outpatient Medications on File Prior to Visit  Medication Sig Dispense Refill  . acitretin (SORIATANE) 10 MG capsule Take 1 capsule (10 mg total) by mouth daily before breakfast. (Patient not taking: Reported on 02/19/2018) 30 capsule 3  . clindamycin (CLINDAGEL) 1 % gel Apply topically 2 (two) times daily. (Patient not taking: Reported on 08/08/2017) 30 g 11  . dicyclomine (BENTYL) 10 MG capsule Take 1 capsule (10 mg total) by mouth 3 (three) times daily as needed for up to 14 days for spasms. 16 capsule 0  . hydrocortisone 1 % ointment Apply 1 application topically 2 (two) times daily. Use x 3-4 weeks (Patient not taking: Reported on 08/08/2017) 30 g 0  . prochlorperazine (COMPAZINE) 10 MG tablet Take 1 tablet (10 mg total) by mouth every 6 (six) hours as needed for nausea or vomiting. (Patient not taking: Reported on 02/19/2018) 12 tablet 0  . tetracycline (ACHROMYCIN,SUMYCIN) 500 MG capsule Take 1 capsule  (500 mg total) by mouth 2 (two) times daily. (Patient not taking: Reported on 08/08/2017) 60 capsule 4   No current facility-administered medications on file prior to visit.     No Known Allergies    Review of Systems Constitutional: negative for chills, fatigue, fevers and sweats Eyes: negative for irritation, redness and visual disturbance Ears, nose, mouth, throat, and face: negative for hearing loss, nasal congestion, snoring and tinnitus Respiratory: negative for asthma, cough, sputum Cardiovascular: negative for chest pain, dyspnea, exertional chest pressure/discomfort, irregular heart beat, palpitations and syncope Gastrointestinal: negative for abdominal pain, change in bowel habits, nausea and vomiting Genitourinary: negative for abnormal menstrual periods, genital lesions, sexual problems and vaginal discharge, dysuria and urinary incontinence Integument/breast: negative for breast lump, breast tenderness and nipple discharge Hematologic/lymphatic: negative for bleeding and easy bruising Musculoskeletal:negative for back pain and muscle weakness Neurological: negative for dizziness, headaches, vertigo and weakness Endocrine: negative for diabetic symptoms including polydipsia, polyuria and skin dryness Allergic/Immunologic: negative for hay fever and urticaria        Objective:  Blood pressure 111/72, pulse 85, height 5\' 9"  (1.753 m), weight (!) 334 lb 1.6 oz (151.5 kg), last menstrual period 01/09/2018. Body mass index is 49.34 kg/m.  General Appearance:    Alert, cooperative, no distress, appears stated age, morbidly obese  Head:    Normocephalic, without obvious abnormality, atraumatic  Eyes:    PERRL, conjunctiva/corneas clear, EOM's intact, both eyes  Ears:    Normal external ear canals, both ears  Nose:   Nares normal, septum midline, mucosa normal, no drainage or sinus tenderness  Throat:   Lips, mucosa, and tongue normal; teeth and gums normal  Neck:   Supple,  symmetrical, trachea midline, no adenopathy; thyroid: no enlargement/tenderness/nodules; no carotid bruit or JVD  Back:     Symmetric, no curvature, ROM normal, no CVA tenderness  Lungs:     Clear to auscultation bilaterally, respirations unlabored  Chest Wall:    No tenderness or deformity   Heart:    Regular rate and rhythm, S1 and S2 normal, no murmur, rub or gallop  Breast Exam:    No tenderness, masses, or nipple abnormality  Abdomen:     Soft, non-tender, bowel sounds active all four quadrants, no masses, no organomegaly.    Genitalia:    Pelvic:external genitalia normal, vagina without lesions, discharge, or tenderness, rectovaginal septum  normal. Cervix normal in appearance, no cervical motion tenderness, no adnexal masses or tenderness.  Uterus  normal size, shape, mobile, regular contours, nontender.  Rectal:    Normal external sphincter.  No hemorrhoids appreciated. Internal exam not done.   Extremities:   Extremities normal, atraumatic, no cyanosis.  Trace pitting edema bilaterally  Pulses:   2+ and symmetric all extremities  Skin:   Striations and thickened darkened folds of axillary skin with keratotic lesions under bilateral axilla.  Also areas present in groin and thigh area.  Lymph nodes:   Cervical, supraclavicular, and axillary nodes normal  Neurologic:   CNII-XII intact, normal strength, sensation and reflexes throughout   .  Labs:  Lab Results  Component Value Date   WBC 4.4 12/18/2017   HGB 12.0 12/18/2017   HCT 35.9 12/18/2017   MCV 85.2 12/18/2017   PLT 310 12/18/2017    Lab Results  Component Value Date   CREATININE 0.91 12/18/2017   BUN 9 12/18/2017   NA 136 12/18/2017   K 3.7 12/18/2017   CL 103 12/18/2017   CO2 26 12/18/2017    Lab Results  Component Value Date   ALT 20 12/18/2017   AST 18 12/18/2017   ALKPHOS 59 12/18/2017   BILITOT 0.3 12/18/2017    Lab Results  Component Value Date   TSH 2.433 11/21/2016     Assessment:    Routine  gynecologic exam.   Morbid obesity  Hydradenitis  Contraception management  HTN  Plan:     Blood tests: Up to date. Breast self exam technique reviewed and patient encouraged to perform self-exam monthly. Contraception: Was abstinent, now desires to resume previous OCP (Microgestin). Will order Discussed healthy lifestyle modifications. Pap smear performed today, including STI screen.   New referral to Dermatology made at patient's request (notes prior Dermatology referral never contacted her back to schedule appointment).  HTN (currently controlled, on no meds).  Noted that bilateral feet swelling may be due to this, or due to standing at job. Recommended compression stocking use during the day.  Follow up in 1 year, or as needed.     Hildred Laser, MD [facility]

## 2018-02-19 NOTE — Patient Instructions (Signed)

## 2018-02-21 LAB — PAP IG, CT-NG, RFX HPV ASCU
Chlamydia, Nuc. Acid Amp: NEGATIVE
GONOCOCCUS BY NUCLEIC ACID AMP: NEGATIVE
PAP Smear Comment: 0

## 2018-06-17 IMAGING — DX DG CHEST 1V PORT
1 series · 1 of 1 positions shown · non-contrast
Comparison: None.

CLINICAL DATA: Mid abdominal pain after eating

EXAM:
PORTABLE CHEST 1 VIEW

[chest ap]
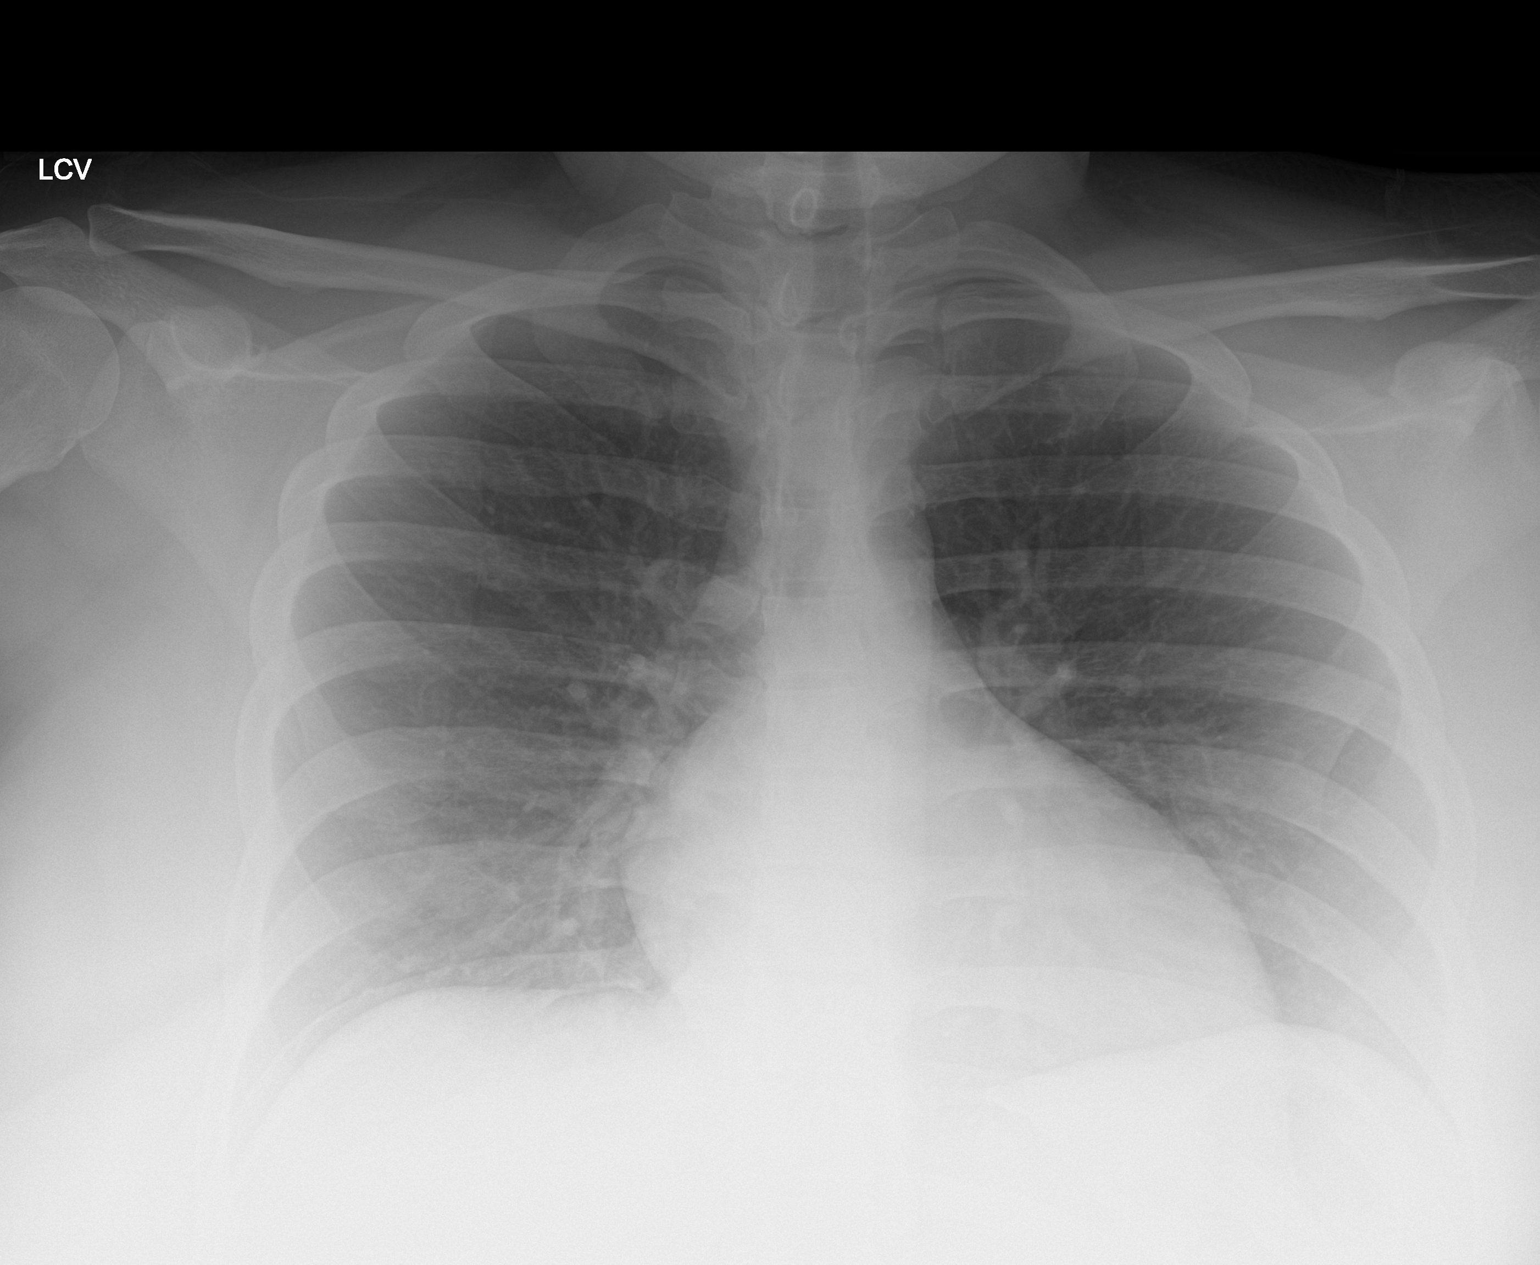

[1 of 1 positions shown; findings below may reference images not displayed]

FINDINGS: The heart size and mediastinal contours are within normal limits.
Both lungs are clear. The visualized skeletal structures are
unremarkable.
IMPRESSION: No active disease.

## 2018-06-21 ENCOUNTER — Encounter: Payer: Self-pay | Admitting: Obstetrics and Gynecology

## 2018-06-21 ENCOUNTER — Ambulatory Visit (INDEPENDENT_AMBULATORY_CARE_PROVIDER_SITE_OTHER): Payer: Medicaid Other | Admitting: Obstetrics and Gynecology

## 2018-06-21 VITALS — BP 133/79 | HR 85 | Ht 69.0 in | Wt 339.4 lb

## 2018-06-21 DIAGNOSIS — R609 Edema, unspecified: Secondary | ICD-10-CM

## 2018-06-21 LAB — POCT URINALYSIS DIPSTICK
BILIRUBIN UA: NEGATIVE
Glucose, UA: NEGATIVE
Ketones, UA: NEGATIVE
Leukocytes, UA: NEGATIVE
Nitrite, UA: NEGATIVE
PH UA: 6.5 (ref 5.0–8.0)
Protein, UA: POSITIVE — AB
RBC UA: NEGATIVE
Spec Grav, UA: 1.02 (ref 1.010–1.025)
UROBILINOGEN UA: 0.2 U/dL

## 2018-06-21 MED ORDER — GABAPENTIN 300 MG PO CAPS: 300.0000 mg | ORAL_CAPSULE | Freq: Two times a day (BID) | ORAL | 2 refills | Status: DC | PRN

## 2018-06-21 MED ORDER — FUROSEMIDE 20 MG PO TABS: 20.0000 mg | ORAL_TABLET | Freq: Every day | ORAL | 1 refills | Status: DC | PRN

## 2018-06-21 NOTE — Progress Notes (Signed)
GYNECOLOGY PROGRESS NOTE  Subjective:    Patient ID: Victoria Gibbs, female    DOB: 1991/06/15, 27 y.o.   MRN: [id number]  HPI  Patient is a 27 y.o. G48P1001 female who presents for complaints of swelling in legs and feet.  Notes that her legs start off ok during the day but by the end of the day her legs are so swollen that she cannot fit in her shoes and has to change into flip flops. Has been ongoing for the past several months, progressively worsening. Notes that she stays hydrated, reduced salt intake, but still symptoms persist. She does note sometimes that she may have SOB upon moderate exertion.   The following portions of the patient's history were reviewed and updated as appropriate: allergies, current medications, past family history, past medical history, past social history, past surgical history and problem list.  Review of Systems Pertinent items noted in HPI and remainder of comprehensive ROS otherwise negative.   Objective:   Blood pressure 133/79, pulse 85, height 5\' 9"  (1.753 m), weight (!) 339 lb 6.4 oz (154 kg), last menstrual period 05/07/2018. General appearance: alert and no distress Lungs: clear to auscultation bilaterally Heart: regular rate and rhythm, S1, S2 normal, no murmur, click, rub or gallop Extremities: extremities atraumatic, no cyanosis or edema and edema noted bilaterally up to mid-calf, trace pitting. Neurologic: Grossly normal    Labs:  Results for orders placed or performed in visit on 06/21/18  POCT urinalysis dipstick  Result Value Ref Range   Color, UA yellow    Clarity, UA clear    Glucose, UA Negative Negative   Bilirubin, UA neg    Ketones, UA neg    Spec Grav, UA 1.020 1.010 - 1.025   Blood, UA neg    pH, UA 6.5 5.0 - 8.0   Protein, UA Positive (A) Negative   Urobilinogen, UA 0.2 0.2 or 1.0 E.U./dL   Nitrite, UA neg    Leukocytes, UA Negative Negative   Appearance yellow    Odor        Chemistry      Component Value  Date/Time   NA 136 12/18/2017 1229   NA 140 03/18/2016 1109   K 3.7 12/18/2017 1229   CL 103 12/18/2017 1229   CO2 26 12/18/2017 1229   BUN 9 12/18/2017 1229   BUN 10 03/18/2016 1109   CREATININE 0.91 12/18/2017 1229      Component Value Date/Time   CALCIUM 8.8 (L) 12/18/2017 1229   ALKPHOS 59 12/18/2017 1229   AST 18 12/18/2017 1229   ALT 20 12/18/2017 1229   BILITOT 0.3 12/18/2017 1229   BILITOT <0.2 03/18/2016 1109       Assessment:   Peripheral edema Morbid obesity  Plan:   - Peripheral edema of unknown cause.  UA performed today to assess for protein (only trace noted today).  Will also recheck patient's CMP to assess for any electrolyte abnormalities (although labs from April were normal).  Patient with normal BP. Discussed prescribing a diuretic to help reduce edema. Can use prn. Also advised on the use of compression stockings. If this doesn't help her neuropathy, can also prescribe Gabapentin to take.  Discussed that if symptoms worsen, will likely need referral to a PCP to evaluate for other causes (kidney disease, CHF, etc). - Morbid obesity - patient has gained 10 lbs in the past year.  Strongly encouraged weight management, and her obesity may also be a factor in her  edema.  - To f/u in 3- 4 weeks.   Hildred Laser, MD [facility]

## 2018-06-21 NOTE — Progress Notes (Signed)
Pt is present today due to having feet and ankle pain. Pt stated that the only time she do not feel pain is when she is not on her feet. No other complaints.

## 2018-06-22 LAB — COMPREHENSIVE METABOLIC PANEL
A/G RATIO: 1.1 — AB (ref 1.2–2.2)
ALT: 15 IU/L (ref 0–32)
AST: 11 IU/L (ref 0–40)
Albumin: 4 g/dL (ref 3.5–5.5)
Alkaline Phosphatase: 61 IU/L (ref 39–117)
BUN/Creatinine Ratio: 15 (ref 9–23)
BUN: 11 mg/dL (ref 6–20)
CHLORIDE: 98 mmol/L (ref 96–106)
CO2: 22 mmol/L (ref 20–29)
Calcium: 9.3 mg/dL (ref 8.7–10.2)
Creatinine, Ser: 0.72 mg/dL (ref 0.57–1.00)
GFR calc non Af Amer: 117 mL/min/{1.73_m2} (ref >59)
GFR, EST AFRICAN AMERICAN: 135 mL/min/{1.73_m2} (ref >59)
GLUCOSE: 75 mg/dL (ref 65–99)
Globulin, Total: 3.8 g/dL (ref 1.5–4.5)
Potassium: 4.4 mmol/L (ref 3.5–5.2)
Sodium: 136 mmol/L (ref 134–144)
TOTAL PROTEIN: 7.8 g/dL (ref 6.0–8.5)

## 2018-07-16 ENCOUNTER — Ambulatory Visit (INDEPENDENT_AMBULATORY_CARE_PROVIDER_SITE_OTHER): Payer: Medicaid Other | Admitting: Obstetrics and Gynecology

## 2018-07-16 ENCOUNTER — Encounter: Payer: Self-pay | Admitting: Obstetrics and Gynecology

## 2018-07-16 VITALS — BP 136/84 | HR 97 | Ht 69.0 in | Wt 340.9 lb

## 2018-07-16 DIAGNOSIS — R609 Edema, unspecified: Secondary | ICD-10-CM

## 2018-07-16 DIAGNOSIS — R0602 Shortness of breath: Secondary | ICD-10-CM

## 2018-07-16 DIAGNOSIS — R5383 Other fatigue: Secondary | ICD-10-CM

## 2018-07-16 LAB — POCT URINALYSIS DIPSTICK
BILIRUBIN UA: NEGATIVE
GLUCOSE UA: NEGATIVE
KETONES UA: NEGATIVE
Protein, UA: POSITIVE — AB
RBC UA: NEGATIVE
SPEC GRAV UA: 1.015 (ref 1.010–1.025)
Urobilinogen, UA: 0.2 E.U./dL
pH, UA: 7 (ref 5.0–8.0)

## 2018-07-16 NOTE — Progress Notes (Signed)
    GYNECOLOGY PROGRESS NOTE  Subjective:    Patient ID: Victoria Gibbs, female    DOB: 1991-06-09, 27 y.o.   MRN: [id number]  HPI  Patient is a 27 y.o. G56P1001 female who presents for follow up of peripheral edema, SOB, and fatigue.  Last visit she was initiated on Lasix prn to help with symptoms.  She notes that the Lasix has helped some, noting some improvement in the edema of her lower extremities, and SOB.  She is still noting some fatigue.   The following portions of the patient's history were reviewed and updated as appropriate: allergies, current medications, past family history, past medical history, past social history, past surgical history and problem list.  Review of Systems Pertinent items noted in HPI and remainder of comprehensive ROS otherwise negative.   Objective:   Blood pressure 136/84, pulse 97, height 5\' 9"  (1.753 m), weight (!) 340 lb 14.4 oz (154.6 kg). General appearance: alert and no distress Extremities: no cyanosis, redness or tenderness in the calves or thighs. Trace pitting edema present to ankles bilaterally.  Neurologic: Grossly normal   Assessment:   Edema (periphreal) Fatigue SOB Morbid obesity  Plan:   - Patient doing better on Lasix. Can continue prn use. Use compression stockings as needed.  - Still noting some fatigue (especially with moderate exertion).  Will continue with workup with EKG, CBC, BNP, Vitamin D level, TSH, renal panel, and screen for DM.  Last TSH last year was normal. Discussed need for possible further follow-up with a PCP/Cardiologist depending on labs.  - Will notify patient of lab results by phone.    Hildred Laser, MD [facility]

## 2018-07-16 NOTE — Progress Notes (Signed)
Pt is present today for a follow up appointment for edema.

## 2018-07-17 LAB — RENAL FUNCTION PANEL
Albumin: 4 g/dL (ref 3.5–5.5)
BUN/Creatinine Ratio: 12 (ref 9–23)
BUN: 10 mg/dL (ref 6–20)
CHLORIDE: 101 mmol/L (ref 96–106)
CO2: 22 mmol/L (ref 20–29)
CREATININE: 0.82 mg/dL (ref 0.57–1.00)
Calcium: 9.5 mg/dL (ref 8.7–10.2)
GFR, EST AFRICAN AMERICAN: 115 mL/min/{1.73_m2} (ref >59)
GFR, EST NON AFRICAN AMERICAN: 100 mL/min/{1.73_m2} (ref >59)
GLUCOSE: 90 mg/dL (ref 65–99)
PHOSPHORUS: 3.7 mg/dL (ref 2.5–4.5)
Potassium: 4.2 mmol/L (ref 3.5–5.2)
Sodium: 138 mmol/L (ref 134–144)

## 2018-07-17 LAB — CBC
HEMATOCRIT: 34.2 % (ref 34.0–46.6)
Hemoglobin: 11.5 g/dL (ref 11.1–15.9)
MCH: 27.8 pg (ref 26.6–33.0)
MCHC: 33.6 g/dL (ref 31.5–35.7)
MCV: 83 fL (ref 79–97)
Platelets: 368 10*3/uL (ref 150–450)
RBC: 4.13 x10E6/uL (ref 3.77–5.28)
RDW: 13.6 % (ref 12.3–15.4)
WBC: 6.3 10*3/uL (ref 3.4–10.8)

## 2018-07-17 LAB — BRAIN NATRIURETIC PEPTIDE: BNP: 12.2 pg/mL (ref 0.0–100.0)

## 2018-07-17 LAB — HEMOGLOBIN A1C
Est. average glucose Bld gHb Est-mCnc: 120 mg/dL
Hgb A1c MFr Bld: 5.8 % — ABNORMAL HIGH (ref 4.8–5.6)

## 2018-07-17 LAB — TSH: TSH: 2.38 u[IU]/mL (ref 0.450–4.500)

## 2018-07-18 LAB — URINE CULTURE

## 2018-10-20 ENCOUNTER — Other Ambulatory Visit: Payer: Self-pay | Admitting: Obstetrics and Gynecology

## 2018-10-23 ENCOUNTER — Telehealth: Payer: Self-pay | Admitting: Obstetrics and Gynecology

## 2018-10-23 NOTE — Telephone Encounter (Signed)
The patient called and stated that she is unable to get her prescription refilled and wanted to speak her provider or nurse to confirm if she needs to do anything to get her prescription. Please advise.

## 2018-10-25 NOTE — Telephone Encounter (Signed)
Pt called and was taking to her and lost signal with pt.

## 2018-12-04 ENCOUNTER — Encounter: Payer: Self-pay | Admitting: Obstetrics and Gynecology

## 2018-12-04 ENCOUNTER — Other Ambulatory Visit: Payer: Self-pay

## 2018-12-04 ENCOUNTER — Ambulatory Visit (INDEPENDENT_AMBULATORY_CARE_PROVIDER_SITE_OTHER): Payer: Self-pay | Admitting: Obstetrics and Gynecology

## 2018-12-04 ENCOUNTER — Other Ambulatory Visit (HOSPITAL_COMMUNITY)
Admission: RE | Admit: 2018-12-04 | Discharge: 2018-12-04 | Disposition: A | Discharge status: Home / Self Care | Payer: PRIVATE HEALTH INSURANCE | Source: Ambulatory Visit | Attending: Obstetrics and Gynecology | Admitting: Obstetrics and Gynecology

## 2018-12-04 VITALS — BP 122/82 | HR 78 | Ht 69.0 in | Wt 335.5 lb

## 2018-12-04 DIAGNOSIS — N898 Other specified noninflammatory disorders of vagina: Secondary | ICD-10-CM | POA: Insufficient documentation

## 2018-12-04 DIAGNOSIS — Z8619 Personal history of other infectious and parasitic diseases: Secondary | ICD-10-CM

## 2018-12-04 HISTORY — DX: Personal history of other infectious and parasitic diseases: Z86.19

## 2018-12-04 NOTE — Progress Notes (Signed)
Pt stated that she noticed some itching after she urinate. Pt stated that at the beginning of her last cycle she noticed brown blood.

## 2018-12-04 NOTE — Progress Notes (Signed)
    GYNECOLOGY PROGRESS NOTE  Subjective:    Patient ID: Victoria Gibbs, female    DOB: October 20, 1990, 28 y.o.   MRN: [id number]  HPI  Patient is a 28 y.o. G28P1001 female who presents for complaints of intermittent vaginal (introitus) itching for the past 1-2 months. She notes occasional thin white discharge, however denies internal vaginal itching or burning.   She also reports that at the beginning of her last cycle she noticed brown blood for the first day, followed by more reddish normal-colored blood during the consecutive days.  Notes no other abnormalities of the cycles. Is currently on combined OCPs for contraception.    Lastly, patient notes that she has been scheduled for weight loss surgery in April. She recently underwent an endoscopy for pre-op testing.    The following portions of the patient's history were reviewed and updated as appropriate: allergies, current medications, past family history, past medical history, past social history, past surgical history and problem list.  Review of Systems Pertinent items noted in HPI and remainder of comprehensive ROS otherwise negative.   Objective:   Blood pressure 122/82, pulse 78, height 5\' 9"  (1.753 m), weight (!) 335 lb 8 oz (152.2 kg), last menstrual period 11/21/2018. Body mass index is 49.54 kg/m.  General appearance: alert and no distress, morbid obesity Abdomen: soft, non-tender; bowel sounds normal; no masses,  no organomegaly Pelvic: external genitalia normal, rectovaginal septum normal.  Vagina with small amount of discharge.  Cervix with "strawberry cervix"appearance, no motion tenderness.  Bimanual exam not performed.    Microscopic wet-mount exam shows few clue cells, no hyphae, no trichomonads, few white blood cells. KOH done.    Assessment:   Vaginal itching Vaginal discharge  Plan:   - Vaginal itching: patient notes intermittent itching, only at introitus, not associated with menstrual cycles. Notes  occasional vaginal discharge, but not consistent.  Wet prep negative today. Discussed use of Vagisil wipes, T-tree oil, or coconut oil to the introitus for relief of symptoms.  - Vaginal discharge: As noted above, wet prep non-specific, however due to abnormality of cervix will send Nuswab for culture. Denies possible STI exposure.  - Discussed that brown blood at the start of her cycle usually represents old blood, and that as long as her cycle had not changed in any other way that this was not anything to be concerned about.   Return to clinic for any scheduled appointments or for any gynecologic concerns as needed.    Hildred Laser, MD [facility]

## 2018-12-06 LAB — CERVICOVAGINAL ANCILLARY ONLY
Bacterial vaginitis: POSITIVE — AB
CHLAMYDIA, DNA PROBE: NEGATIVE
Candida vaginitis: POSITIVE — AB
NEISSERIA GONORRHEA: NEGATIVE
TRICH (WINDOWPATH): POSITIVE — AB

## 2018-12-07 ENCOUNTER — Other Ambulatory Visit: Payer: Self-pay

## 2018-12-07 MED ORDER — FLUCONAZOLE 150 MG PO TABS: 150.0000 mg | ORAL_TABLET | Freq: Once | ORAL | 0 refills | Status: AC

## 2018-12-07 MED ORDER — METRONIDAZOLE 500 MG PO TABS: 500.0000 mg | ORAL_TABLET | Freq: Two times a day (BID) | ORAL | 1 refills | Status: DC

## 2019-01-29 ENCOUNTER — Telehealth: Payer: Self-pay | Admitting: Family Medicine

## 2019-01-29 NOTE — Telephone Encounter (Signed)
Called patient because it was 05/2015 when she was last seen, she said she still wants to see Dr Dayton Martes as her PCP and will call back soon to set up appt to follow up with Dr Dayton Martes, she is aware Dr Dayton Martes is at [place] now

## 2019-03-20 ENCOUNTER — Other Ambulatory Visit: Payer: Self-pay | Admitting: Obstetrics and Gynecology

## 2019-03-20 NOTE — Telephone Encounter (Signed)
Patient needs annual exam prior to next refill.  

## 2019-04-02 ENCOUNTER — Other Ambulatory Visit: Payer: Self-pay | Admitting: Obstetrics and Gynecology

## 2019-06-08 MED ORDER — BAYER WOMENS 81-300 MG PO TABS
40.00 | ORAL_TABLET | ORAL | Status: DC
Start: 2019-06-09 — End: 2019-06-08

## 2019-06-08 MED ORDER — Medication
6.25 | Status: DC
Start: ? — End: 2019-06-08

## 2019-06-08 MED ORDER — CVS EAR DROPS OT
40.00 | OTIC | Status: DC
Start: 2019-06-08 — End: 2019-06-08

## 2019-06-08 MED ORDER — X-5 THERAPEUTIC EX
1.00 | CUTANEOUS | Status: DC
Start: 2019-06-08 — End: 2019-06-08

## 2019-06-08 MED ORDER — RU-HIST FORTE PO
15.00 | ORAL | Status: DC
Start: 2019-06-08 — End: 2019-06-08

## 2019-06-08 MED ORDER — PCCA BIOPEPTIDE BASE EX CREA
300.00 | TOPICAL_CREAM | CUTANEOUS | Status: DC
Start: 2019-06-08 — End: 2019-06-08

## 2019-06-08 MED ORDER — EQUATE NICOTINE 4 MG MT GUM
4.00 | CHEWING_GUM | OROMUCOSAL | Status: DC
Start: ? — End: 2019-06-08

## 2019-06-08 MED ORDER — MEDI-TUSSIN DM DOUBLE STRENGTH 30-200 MG/5ML PO LIQD
1000.00 | ORAL | Status: DC
Start: 2019-06-08 — End: 2019-06-08

## 2019-06-08 MED ORDER — ISOVUE-M 300 61 % IJ SOLN
4.00 | INTRAMUSCULAR | Status: DC
Start: ? — End: 2019-06-08

## 2019-06-08 MED ORDER — OXYCODONE HCL 5 MG PO TABS
5.00 | ORAL_TABLET | ORAL | Status: DC
Start: ? — End: 2019-06-08

## 2019-06-08 MED ORDER — METRISET BURETTE SET MISC
Status: DC
Start: ? — End: 2019-06-08

## 2019-07-02 ENCOUNTER — Ambulatory Visit (INDEPENDENT_AMBULATORY_CARE_PROVIDER_SITE_OTHER): Payer: Medicaid Other | Admitting: Obstetrics and Gynecology

## 2019-07-02 ENCOUNTER — Other Ambulatory Visit (HOSPITAL_COMMUNITY)
Admission: RE | Admit: 2019-07-02 | Discharge: 2019-07-02 | Disposition: A | Discharge status: Home / Self Care | Payer: PRIVATE HEALTH INSURANCE | Source: Ambulatory Visit | Attending: Obstetrics and Gynecology | Admitting: Obstetrics and Gynecology

## 2019-07-02 ENCOUNTER — Other Ambulatory Visit: Payer: Self-pay

## 2019-07-02 ENCOUNTER — Encounter: Payer: Self-pay | Admitting: Obstetrics and Gynecology

## 2019-07-02 VITALS — BP 133/84 | HR 61 | Ht 69.0 in | Wt 309.9 lb

## 2019-07-02 DIAGNOSIS — Z8619 Personal history of other infectious and parasitic diseases: Secondary | ICD-10-CM

## 2019-07-02 DIAGNOSIS — Z0000 Encounter for general adult medical examination without abnormal findings: Secondary | ICD-10-CM

## 2019-07-02 DIAGNOSIS — Z23 Encounter for immunization: Secondary | ICD-10-CM

## 2019-07-02 DIAGNOSIS — L732 Hidradenitis suppurativa: Secondary | ICD-10-CM

## 2019-07-02 DIAGNOSIS — Z01419 Encounter for gynecological examination (general) (routine) without abnormal findings: Secondary | ICD-10-CM

## 2019-07-02 NOTE — Patient Instructions (Signed)
Preventive Care 20-27 Years Old, Female Preventive care refers to visits with your health care provider and lifestyle choices that can promote health and wellness. This includes:  A yearly physical exam. This may also be called an annual well check.  Regular dental visits and eye exams.  Immunizations.  Screening for certain conditions.  Healthy lifestyle choices, such as eating a healthy diet, getting regular exercise, not using drugs or products that contain nicotine and tobacco, and limiting alcohol use. What can I expect for my preventive care visit? Physical exam Your health care provider will check your:  Height and weight. This may be used to calculate body mass index (BMI), which tells if you are at a healthy weight.  Heart rate and blood pressure.  Skin for abnormal spots. Counseling Your health care provider may ask you questions about your:  Alcohol, tobacco, and drug use.  Emotional well-being.  Home and relationship well-being.  Sexual activity.  Eating habits.  Work and work Statistician.  Method of birth control.  Menstrual cycle.  Pregnancy history. What immunizations do I need?  Influenza (flu) vaccine  This is recommended every year. Tetanus, diphtheria, and pertussis (Tdap) vaccine  You may need a Td booster every 10 years. Varicella (chickenpox) vaccine  You may need this if you have not been vaccinated. Human papillomavirus (HPV) vaccine  If recommended by your health care provider, you may need three doses over 6 months. Measles, mumps, and rubella (MMR) vaccine  You may need at least one dose of MMR. You may also need a second dose. Meningococcal conjugate (MenACWY) vaccine  One dose is recommended if you are age 75-21 years and a first-year college student living in a residence hall, or if you have one of several medical conditions. You may also need additional booster doses. Pneumococcal conjugate (PCV13) vaccine  You may need  this if you have certain conditions and were not previously vaccinated. Pneumococcal polysaccharide (PPSV23) vaccine  You may need one or two doses if you smoke cigarettes or if you have certain conditions. Hepatitis A vaccine  You may need this if you have certain conditions or if you travel or work in places where you may be exposed to hepatitis A. Hepatitis B vaccine  You may need this if you have certain conditions or if you travel or work in places where you may be exposed to hepatitis B. Haemophilus influenzae type b (Hib) vaccine  You may need this if you have certain conditions. You may receive vaccines as individual doses or as more than one vaccine together in one shot (combination vaccines). Talk with your health care provider about the risks and benefits of combination vaccines. What tests do I need?  Blood tests  Lipid and cholesterol levels. These may be checked every 5 years starting at age 33.  Hepatitis C test.  Hepatitis B test. Screening  Diabetes screening. This is done by checking your blood sugar (glucose) after you have not eaten for a while (fasting).  Sexually transmitted disease (STD) testing.  BRCA-related cancer screening. This may be done if you have a family history of breast, ovarian, tubal, or peritoneal cancers.  Pelvic exam and Pap test. This may be done every 3 years starting at age 76. Starting at age 102, this may be done every 5 years if you have a Pap test in combination with an HPV test. Talk with your health care provider about your test results, treatment options, and if necessary, the need for more tests.  Follow these instructions at home: °Eating and drinking ° °· Eat a diet that includes fresh fruits and vegetables, whole grains, lean protein, and low-fat dairy. °· Take vitamin and mineral supplements as recommended by your health care provider. °· Do not drink alcohol if: °? Your health care provider tells you not to drink. °? You are  pregnant, may be pregnant, or are planning to become pregnant. °· If you drink alcohol: °? Limit how much you have to 0-1 drink a day. °? Be aware of how much alcohol is in your drink. In the [place] one drink equals one 12 oz bottle of beer (355 mL), one 5 oz glass of wine (148 mL), or one 1½ oz glass of hard liquor (44 mL). °Lifestyle °· Take daily care of your teeth and gums. °· Stay active. Exercise for at least 30 minutes on 5 or more days each week. °· Do not use any products that contain nicotine or tobacco, such as cigarettes, e-cigarettes, and chewing tobacco. If you need help quitting, ask your health care provider. °· If you are sexually active, practice safe sex. Use a condom or other form of birth control (contraception) in order to prevent pregnancy and STIs (sexually transmitted infections). If you plan to become pregnant, see your health care provider for a preconception visit. °What's next? °· Visit your health care provider once a year for a well check visit. °· Ask your health care provider how often you should have your eyes and teeth checked. °· Stay up to date on all vaccines. °This information is not intended to replace advice given to you by your health care provider. Make sure you discuss any questions you have with your health care provider. °Document Released: 11/01/2001 Document Revised: 05/17/2018 Document Reviewed: 05/17/2018 °Elsevier Patient Education © 2020 [employer] Inc[employer] °Breast Self-Awareness °Breast self-awareness is knowing how your breasts look and feel. Doing breast self-awareness is important. It allows you to catch a breast problem early while it is still small and can be treated. All women should do breast self-awareness, including women who have had breast implants. Tell your doctor if you notice a change in your breasts. °What you need: °· A mirror. °· A well-lit room. °How to do a breast self-exam °A breast self-exam is one way to learn what is normal for your breasts and to  check for changes. To do a breast self-exam: °Look for changes ° °1. Take off all the clothes above your waist. °2. Stand in front of a mirror in a room with good lighting. °3. Put your hands on your hips. °4. Push your hands down. °5. Look at your breasts and nipples in the mirror to see if one breast or nipple looks different from the other. Check to see if: °? The shape of one breast is different. °? The size of one breast is different. °? There are wrinkles, dips, and bumps in one breast and not the other. °6. Look at each breast for changes in the skin, such as: °? Redness. °? Scaly areas. °7. Look for changes in your nipples, such as: °? Liquid around the nipples. °? Bleeding. °? Dimpling. °? Redness. °? A change in where the nipples are. °Feel for changes ° °1. Lie on your back on the floor. °2. Feel each breast. To do this, follow these steps: °? Pick a breast to feel. °? Put the arm closest to that breast above your head. °? Use your other arm to feel the nipple area of   your breast. Feel the area with the pads of your three middle fingers by making small circles with your fingers. For the first circle, press lightly. For the second circle, press harder. For the third circle, press even harder. °? Keep making circles with your fingers at the different pressures as you move down your breast. Stop when you feel your ribs. °? Move your fingers a little toward the center of your body. °? Start making circles with your fingers again, this time going up until you reach your collarbone. °? Keep making up-and-down circles until you reach your armpit. Remember to keep using the three pressures. °? Feel the other breast in the same way. °3. Sit or stand in the tub or shower. °4. With soapy water on your skin, feel each breast the same way you did in step 2 when you were lying on the floor. °Write down what you find °Writing down what you find can help you remember what to tell your doctor. Write down: °· What is  normal for each breast. °· Any changes you find in each breast, including: °? The kind of changes you find. °? Whether you have pain. °? Size and location of any lumps. °· When you last had your menstrual period. °General tips °· Check your breasts every month. °· If you are breastfeeding, the best time to check your breasts is after you feed your baby or after you use a breast pump. °· If you get menstrual periods, the best time to check your breasts is 5-7 days after your menstrual period is over. °· With time, you will become comfortable with the self-exam, and you will begin to know if there are changes in your breasts. °Contact a doctor if you: °· See a change in the shape or size of your breasts or nipples. °· See a change in the skin of your breast or nipples, such as red or scaly skin. °· Have fluid coming from your nipples that is not normal. °· Find a lump or thick area that was not there before. °· Have pain in your breasts. °· Have any concerns about your breast health. °Summary °· Breast self-awareness includes looking for changes in your breasts, as well as feeling for changes within your breasts. °· Breast self-awareness should be done in front of a mirror in a well-lit room. °· You should check your breasts every month. If you get menstrual periods, the best time to check your breasts is 5-7 days after your menstrual period is over. °· Let your doctor know of any changes you see in your breasts, including changes in size, changes on the skin, pain or tenderness, or fluid from your nipples that is not normal. °This information is not intended to replace advice given to you by your health care provider. Make sure you discuss any questions you have with your health care provider. °Document Released: 02/22/2008 Document Revised: 04/24/2018 Document Reviewed: 04/24/2018 °Elsevier Patient Education © 2020 Elsevier Inc. ° °

## 2019-07-02 NOTE — Progress Notes (Signed)
GYNECOLOGY ANNUAL PHYSICAL EXAM PROGRESS NOTE  Subjective:    Victoria Gibbs is a 28 y.o. G108P1001 female who presents for an annual exam. The patient has no complaints today. The patient is sexually active. The patient wears seatbelts: yes. The patient participates in regular exercise: yes. Has the patient ever been transfused or tattooed?: no. The patient reports that there is not domestic violence in her life.   Of note, patient had bariatric surgery last month.  Has lost a total of 31 lbs so far. Overall doing well.   Gynecologic History  Menarche age: 47 Patient's last menstrual period was 06/08/2019. Cycles last 4-5 days, moderate. Accompanied with mild to moderate dysmenorrhea managed by Ibuprofen.  Contraception: oral progesterone-only contraceptive History of STI's: Trichomonas, treated in 11/2018.  Last Pap: 05/2018. Results were: normal.  Denies h/o abnormal pap smears.    OB History  Gravida Para Term Preterm AB Living  1 1 1  0 0 1  SAB TAB Ectopic Multiple Live Births  0 0 0 0 1    # Outcome Date GA Lbr Len/2nd Weight Sex Delivery Anes PTL Lv  1 Term 01/23/16 [redacted]w[redacted]d 09:22 / 01:48 7 lb 8.3 oz (3.41 kg) M Vag-Forceps EPI  LIV     Birth Comments: couple of tiny nevus on right rib-cage area     Name: Quaranta,BOY Lylla     Apgar1: [place]: 9    Past Medical History:  Diagnosis Date  . Hidradenitis suppurativa   . History of trichomoniasis 12/04/2018  . Hypertension   . Obesity     Past Surgical History:  Procedure Laterality Date  . SLEEVE GASTROPLASTY      Family History  Problem Relation Age of Onset  . Hypertension Mother   . Diabetes Brother   . Breast cancer Neg Hx   . Ovarian cancer Neg Hx   . Colon cancer Neg Hx     Social History   Socioeconomic History  . Marital status: Single    Spouse name: Not on file  . Number of children: Not on file  . Years of education: Not on file  . Highest education level: Not on file  Occupational History   . Not on file  Social Needs  . Financial resource strain: Not on file  . Food insecurity    Worry: Not on file    Inability: Not on file  . Transportation needs    Medical: Not on file    Non-medical: Not on file  Tobacco Use  . Smoking status: Never Smoker  . Smokeless tobacco: Never Used  Substance and Sexual Activity  . Alcohol use: No    Comment: occasional  . Drug use: No  . Sexual activity: Yes    Birth control/protection: Pill  Lifestyle  . Physical activity    Days per week: Not on file    Minutes per session: Not on file  . Stress: Not on file  Relationships  . Social Herbalist on phone: Not on file    Gets together: Not on file    Attends religious service: Not on file    Active member of club or organization: Not on file    Attends meetings of clubs or organizations: Not on file    Relationship status: Not on file  . Intimate partner violence    Fear of current or ex partner: Not on file    Emotionally abused: Not on file  Physically abused: Not on file    Forced sexual activity: Not on file  Other Topics Concern  . Not on file  Social History Narrative  . Not on file    Current Outpatient Medications on File Prior to Visit  Medication Sig Dispense Refill  . CALCIUM PO Take by mouth.    Marland Kitchen. MICROGESTIN 1.5-30 MG-MCG tablet Take 1 tablet by mouth once daily 28 tablet 3  . Multiple Vitamin (MULTI-VITAMIN DAILY PO) Take by mouth.     No current facility-administered medications on file prior to visit.     No Known Allergies    Review of Systems Constitutional: negative for chills, fatigue, fevers and sweats Eyes: negative for irritation, redness and visual disturbance Ears, nose, mouth, throat, and face: negative for hearing loss, nasal congestion, snoring and tinnitus Respiratory: negative for asthma, cough, sputum Cardiovascular: negative for chest pain, dyspnea, exertional chest pressure/discomfort, irregular heart beat, palpitations  and syncope Gastrointestinal: negative for abdominal pain, change in bowel habits, nausea and vomiting Genitourinary: negative for abnormal menstrual periods, genital lesions, sexual problems and vaginal discharge, dysuria and urinary incontinence Integument/breast: negative for breast lump, breast tenderness and nipple discharge.  Axillary regions with scarring, hyperkeratosis.  Hematologic/lymphatic: negative for bleeding and easy bruising Musculoskeletal:negative for back pain and muscle weakness Neurological: negative for dizziness, headaches, vertigo and weakness Endocrine: negative for diabetic symptoms including polydipsia, polyuria and skin dryness Allergic/Immunologic: negative for hay fever and urticaria        Objective:  Blood pressure 133/84, pulse 61, height 5\' 9"  (1.753 m), weight (!) 309 lb 14.4 oz (140.6 kg), last menstrual period 06/08/2019. Body mass index is 45.76 kg/m.  General Appearance:    Alert, cooperative, no distress, appears stated age  Head:    Normocephalic, without obvious abnormality, atraumatic  Eyes:    PERRL, conjunctiva/corneas clear, EOM's intact, both eyes  Ears:    Normal external ear canals, both ears  Nose:   Nares normal, septum midline, mucosa normal, no drainage or sinus tenderness  Throat:   Lips, mucosa, and tongue normal; teeth and gums normal  Neck:   Supple, symmetrical, trachea midline, no adenopathy; thyroid: no enlargement/tenderness/nodules; no carotid bruit or JVD  Back:     Symmetric, no curvature, ROM normal, no CVA tenderness  Lungs:     Clear to auscultation bilaterally, respirations unlabored  Chest Wall:    No tenderness or deformity   Heart:    Regular rate and rhythm, S1 and S2 normal, no murmur, rub or gallop  Breast Exam:    No tenderness, masses, or nipple abnormality  Abdomen:     Soft, non-tender, bowel sounds active all four quadrants, no masses, no organomegaly.    Genitalia:    Pelvic:external genitalia normal, vagina  without lesions, discharge, or tenderness, rectovaginal septum  normal. Cervix normal in appearance, no cervical motion tenderness, no adnexal masses or tenderness.  Uterus normal size, shape, mobile, regular contours, nontender.  Rectal:    Normal external sphincter.  No hemorrhoids appreciated. Internal exam not done.   Extremities:   Extremities normal, atraumatic, no cyanosis or edema  Pulses:   2+ and symmetric all extremities  Skin:   Skin color, texture, turgor normal, no rashes or lesions  Lymph nodes:   Cervical, supraclavicular, and axillary nodes normal  Neurologic:   CNII-XII intact, normal strength, sensation and reflexes throughout   .  Labs:  Lab Results  Component Value Date   WBC 6.3 07/16/2018   HGB 11.5 07/16/2018  HCT 34.2 07/16/2018   MCV 83 07/16/2018   PLT 368 07/16/2018    Lab Results  Component Value Date   CREATININE 0.82 07/16/2018   BUN 10 07/16/2018   NA 138 07/16/2018   K 4.2 07/16/2018   CL 101 07/16/2018   CO2 22 07/16/2018    Lab Results  Component Value Date   ALT 15 06/21/2018   AST 11 06/21/2018   ALKPHOS 61 06/21/2018   BILITOT <0.2 06/21/2018    Lab Results  Component Value Date   TSH 2.380 07/16/2018    Labs reviewed in Care Everywhere ([facility]), from 09/2018 and 05/2019.   Assessment:   1. Encounter for well woman exam with routine gynecological exam   2. Flu vaccine need   3. History of trichomoniasis   4. Morbid obesity with BMI of 45.0-49.9, adult (HCC)   5. Hydradenitis     Plan:    Blood tests: None ordered.  Up to date. Breast self exam technique reviewed and patient encouraged to perform self-exam monthly. Discussed healthy lifestyle modifications. Continue to encourage healthy weight loss s/p gastric bypass.  Pap smear up to date.   Notes hydradenitis has improved, notes a decrease in flares. Continue current management.  History of trichomoniasis earlier this year, patient desires vaginal/cervical STI screening  today. Declines HIV testing.  Flu vaccine given today.  RTC in 1 year for annual exam.     Hildred Laser, MD [facility]

## 2019-07-02 NOTE — Progress Notes (Signed)
Pt is present for annual exam. Pt stated that she was doing well no problems. Pt had flu vaccine today.

## 2019-07-08 LAB — CERVICOVAGINAL ANCILLARY ONLY
Chlamydia: NEGATIVE
Comment: NEGATIVE
Comment: NEGATIVE
Comment: NORMAL
Neisseria Gonorrhea: NEGATIVE
Trichomonas: NEGATIVE

## 2019-08-12 ENCOUNTER — Telehealth: Payer: Self-pay | Admitting: Obstetrics and Gynecology

## 2019-08-12 ENCOUNTER — Other Ambulatory Visit: Payer: Self-pay

## 2019-08-12 DIAGNOSIS — Z20822 Contact with and (suspected) exposure to covid-19: Secondary | ICD-10-CM

## 2019-08-12 MED ORDER — NORETHINDRONE ACET-ETHINYL EST 1.5-30 MG-MCG PO TABS: 1.0000 | ORAL_TABLET | Freq: Every day | ORAL | 11 refills | Status: DC

## 2019-08-12 NOTE — Telephone Encounter (Signed)
Pt called needing a refill of her birth control. Pt requesting a call back. Please advise.

## 2019-08-14 LAB — NOVEL CORONAVIRUS, NAA: SARS-CoV-2, NAA: NOT DETECTED

## 2019-08-20 NOTE — Telephone Encounter (Signed)
Pt is aware that her medication was refilled last week on the 23 of November with 11 refills. Pt stated that she received a call from the pharmacy last week. Pt picked up her medication.

## 2019-10-02 ENCOUNTER — Other Ambulatory Visit: Payer: Self-pay

## 2019-10-02 ENCOUNTER — Encounter: Payer: Self-pay | Admitting: Obstetrics and Gynecology

## 2019-10-02 ENCOUNTER — Ambulatory Visit (INDEPENDENT_AMBULATORY_CARE_PROVIDER_SITE_OTHER): Payer: PRIVATE HEALTH INSURANCE | Admitting: Obstetrics and Gynecology

## 2019-10-02 ENCOUNTER — Other Ambulatory Visit (HOSPITAL_COMMUNITY)
Admission: RE | Admit: 2019-10-02 | Discharge: 2019-10-02 | Disposition: A | Discharge status: Home / Self Care | Payer: PRIVATE HEALTH INSURANCE | Source: Ambulatory Visit | Attending: Obstetrics and Gynecology | Admitting: Obstetrics and Gynecology

## 2019-10-02 VITALS — BP 133/76 | HR 79 | Ht 69.0 in | Wt 278.1 lb

## 2019-10-02 DIAGNOSIS — Z9884 Bariatric surgery status: Secondary | ICD-10-CM

## 2019-10-02 DIAGNOSIS — Z113 Encounter for screening for infections with a predominantly sexual mode of transmission: Secondary | ICD-10-CM

## 2019-10-02 DIAGNOSIS — Z3202 Encounter for pregnancy test, result negative: Secondary | ICD-10-CM | POA: Diagnosis not present

## 2019-10-02 DIAGNOSIS — N926 Irregular menstruation, unspecified: Secondary | ICD-10-CM | POA: Diagnosis not present

## 2019-10-02 LAB — POCT URINE PREGNANCY: Preg Test, Ur: NEGATIVE

## 2019-10-02 MED ORDER — NORETHINDRONE ACET-ETHINYL EST 1.5-30 MG-MCG PO TABS: 1.0000 | ORAL_TABLET | Freq: Every day | ORAL | 3 refills | Status: DC

## 2019-10-02 NOTE — Patient Instructions (Signed)
Abnormal Uterine Bleeding °Abnormal uterine bleeding means bleeding more than usual from your uterus. It can include: °· Bleeding between periods. °· Bleeding after sex. °· Bleeding that is heavier than normal. °· Periods that last longer than usual. °· Bleeding after you have stopped having your period (menopause). °There are many problems that may cause this. You should see a doctor for any kind of bleeding that is not normal. Treatment depends on the cause of the bleeding. °Follow these instructions at home: °· Watch your condition for any changes. °· Do not use tampons, douche, or have sex, if your doctor tells you not to. °· Change your pads often. °· Get regular well-woman exams. Make sure they include a pelvic exam and cervical cancer screening. °· Keep all follow-up visits as told by your doctor. This is important. °Contact a doctor if: °· The bleeding lasts more than one week. °· You feel dizzy at times. °· You feel like you are going to throw up (nauseous). °· You throw up. °Get help right away if: °· You pass out. °· You have to change pads every hour. °· You have belly (abdominal) pain. °· You have a fever. °· You get sweaty. °· You get weak. °· You passing large blood clots from your vagina. °Summary °· Abnormal uterine bleeding means bleeding more than usual from your uterus. °· There are many problems that may cause this. You should see a doctor for any kind of bleeding that is not normal. °· Treatment depends on the cause of the bleeding. °This information is not intended to replace advice given to you by your health care provider. Make sure you discuss any questions you have with your health care provider. °Document Revised: 08/30/2016 Document Reviewed: 08/30/2016 °Elsevier Patient Education © 2020 Elsevier Inc. ° °

## 2019-10-02 NOTE — Progress Notes (Signed)
Pt present due to having prolonged cycles. Pt LMP 09/04/19 and ended 09/26/2019. Pt stated that her cycles were heavy, clots, cramping.

## 2019-10-02 NOTE — Progress Notes (Signed)
    GYNECOLOGY PROGRESS NOTE  Subjective:    Patient ID: Victoria Gibbs, female    DOB: 30-Sep-1990, 29 y.o.   MRN: [id number]  HPI  Patient is a 29 y.o. G15P1001 female who presents for an abnormal menstrual cycle. 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 Her most recent menstrual cycle lasted from 12/16-1/6. Cycles are usually every 28 days, lasting 5 days. Current cycle was heavier, changing a pad every 2 hr with associated cramping, using Tylenol.  Has also had string vaginal discharge over the last few days. She denies any odor or itching. Currently using OCPs for contraception. She reports that she did not miss any pills. Needs refill on OCPs. Also noting some recent stress on her job. Lastly, she desires STD screening today. Denies any history of recent STD exposure.   The following portions of the patient's history were reviewed and updated as appropriate: allergies, current medications, past family history, past medical history, past social history, past surgical history and problem list.    Review of Systems Pertinent items noted in HPI and remainder of comprehensive ROS otherwise negative.   Objective:   Blood pressure 133/76, pulse 79, height 5\' 9"  (1.753 m), weight 278 lb 1.6 oz (126.1 kg), last menstrual period 09/04/2019. General appearance: alert and no distress Abdomen: soft, non-tender; bowel sounds normal; no masses,  no organomegaly Pelvic: external genitalia normal, rectovaginal septum normal.  Vagina without discharge, small amount of dark red blood in vaginal vault.  Cervix normal appearing, no lesions and no motion tenderness.  Uterus mobile, nontender, normal shape and size.  Adnexae non-palpable, nontender bilaterally.  Extremities: extremities normal, atraumatic, no cyanosis or edema Neurologic: Grossly normal   Assessment:   Abnormal menstrual cycle  Screen for STD (sexually transmitted disease)  History of gastric bypass  Plan:   1. Abnormal  menstrual cycle - unclear cause. Could be related to weight changes as patient has had gastric bypass, recent stress.  Patient denies missing any pills.  UPT negative today. Bleeding has practically stopped. Advised on expectant management for now, if cycles continue to be abnormal, can proceed with further workup.  Will refill OCPs.  2. Desires STD (vaginitis) screen, will perform today.  3. Further workup and treatment could be done if symptoms persist, worsen or new related symptoms occur. The patient will call in that eventuality.   09/06/2019, MD [facility]

## 2019-10-03 ENCOUNTER — Encounter: Payer: Self-pay | Admitting: Obstetrics and Gynecology

## 2019-10-04 LAB — CERVICOVAGINAL ANCILLARY ONLY
Chlamydia: NEGATIVE
Comment: NEGATIVE
Comment: NEGATIVE
Comment: NORMAL
Neisseria Gonorrhea: NEGATIVE
Trichomonas: NEGATIVE

## 2020-03-11 ENCOUNTER — Telehealth: Payer: Self-pay | Admitting: General Practice

## 2020-03-11 DIAGNOSIS — Z713 Dietary counseling and surveillance: Secondary | ICD-10-CM | POA: Diagnosis not present

## 2020-03-11 DIAGNOSIS — Z9884 Bariatric surgery status: Secondary | ICD-10-CM | POA: Diagnosis not present

## 2020-03-11 DIAGNOSIS — K912 Postsurgical malabsorption, not elsewhere classified: Secondary | ICD-10-CM | POA: Diagnosis not present

## 2020-03-11 DIAGNOSIS — Z6841 Body Mass Index (BMI) 40.0 and over, adult: Secondary | ICD-10-CM | POA: Diagnosis not present

## 2020-03-11 NOTE — Telephone Encounter (Signed)
Removed Dr. Aron as patient's PCP due to her leaving this practice and patient has not been seen by her in 5 years.  

## 2020-06-08 DIAGNOSIS — Z713 Dietary counseling and surveillance: Secondary | ICD-10-CM | POA: Diagnosis not present

## 2020-06-08 DIAGNOSIS — Z79899 Other long term (current) drug therapy: Secondary | ICD-10-CM | POA: Diagnosis not present

## 2020-06-08 DIAGNOSIS — Z48815 Encounter for surgical aftercare following surgery on the digestive system: Secondary | ICD-10-CM | POA: Diagnosis not present

## 2020-06-08 DIAGNOSIS — Z9884 Bariatric surgery status: Secondary | ICD-10-CM | POA: Diagnosis not present

## 2020-06-08 DIAGNOSIS — Y838 Other surgical procedures as the cause of abnormal reaction of the patient, or of later complication, without mention of misadventure at the time of the procedure: Secondary | ICD-10-CM | POA: Diagnosis not present

## 2020-06-08 DIAGNOSIS — K912 Postsurgical malabsorption, not elsewhere classified: Secondary | ICD-10-CM | POA: Diagnosis not present

## 2020-06-08 DIAGNOSIS — Z6839 Body mass index (BMI) 39.0-39.9, adult: Secondary | ICD-10-CM | POA: Diagnosis not present

## 2020-07-02 ENCOUNTER — Encounter: Payer: Medicaid Other | Admitting: Obstetrics and Gynecology

## 2020-07-06 ENCOUNTER — Telehealth: Payer: Self-pay

## 2020-07-06 NOTE — Telephone Encounter (Signed)
Pt called in and needs a refill on her [facility] sent to [facility] on [address]. Please advise

## 2020-07-07 ENCOUNTER — Other Ambulatory Visit: Payer: Self-pay

## 2020-07-07 MED ORDER — NORETHINDRONE ACET-ETHINYL EST 1.5-30 MG-MCG PO TABS: 1.0000 | ORAL_TABLET | Freq: Every day | ORAL | 0 refills | Status: DC

## 2020-07-07 NOTE — Telephone Encounter (Signed)
Please let pt know that her medication has been refilled sent pt a mychart message. Thanks [employer]

## 2020-07-07 NOTE — Telephone Encounter (Signed)
Called pt and told her that her refill was filed. The pt was thankful

## 2020-07-20 NOTE — Progress Notes (Signed)
Pt present for annual exam. Pt stated  Pt's pap is up to date and  flu vaccine completed unsure about the date will send mychart message with information.

## 2020-07-20 NOTE — Patient Instructions (Addendum)
Preventive Care 20-28 Years Old, Female Preventive care refers to visits with your health care provider and lifestyle choices that can promote health and wellness. This includes:  A yearly physical exam. This may also be called an annual well check.  Regular dental visits and eye exams.  Immunizations.  Screening for certain conditions.  Healthy lifestyle choices, such as eating a healthy diet, getting regular exercise, not using drugs or products that contain nicotine and tobacco, and limiting alcohol use. What can I expect for my preventive care visit? Physical exam Your health care provider will check your:  Height and weight. This may be used to calculate body mass index (BMI), which tells if you are at a healthy weight.  Heart rate and blood pressure.  Skin for abnormal spots. Counseling Your health care provider may ask you questions about your:  Alcohol, tobacco, and drug use.  Emotional well-being.  Home and relationship well-being.  Sexual activity.  Eating habits.  Work and work Statistician.  Method of birth control.  Menstrual cycle.  Pregnancy history. What immunizations do I need?  Influenza (flu) vaccine  This is recommended every year. Tetanus, diphtheria, and pertussis (Tdap) vaccine  You may need a Td booster every 10 years. Varicella (chickenpox) vaccine  You may need this if you have not been vaccinated. Human papillomavirus (HPV) vaccine  If recommended by your health care provider, you may need three doses over 6 months. Measles, mumps, and rubella (MMR) vaccine  You may need at least one dose of MMR. You may also need a second dose. Meningococcal conjugate (MenACWY) vaccine  One dose is recommended if you are age 75-21 years and a first-year college student living in a residence hall, or if you have one of several medical conditions. You may also need additional booster doses. Pneumococcal conjugate (PCV13) vaccine  You may need  this if you have certain conditions and were not previously vaccinated. Pneumococcal polysaccharide (PPSV23) vaccine  You may need one or two doses if you smoke cigarettes or if you have certain conditions. Hepatitis A vaccine  You may need this if you have certain conditions or if you travel or work in places where you may be exposed to hepatitis A. Hepatitis B vaccine  You may need this if you have certain conditions or if you travel or work in places where you may be exposed to hepatitis B. Haemophilus influenzae type b (Hib) vaccine  You may need this if you have certain conditions. You may receive vaccines as individual doses or as more than one vaccine together in one shot (combination vaccines). Talk with your health care provider about the risks and benefits of combination vaccines. What tests do I need?  Blood tests  Lipid and cholesterol levels. These may be checked every 5 years starting at age 33.  Hepatitis C test.  Hepatitis B test. Screening  Diabetes screening. This is done by checking your blood sugar (glucose) after you have not eaten for a while (fasting).  Sexually transmitted disease (STD) testing.  BRCA-related cancer screening. This may be done if you have a family history of breast, ovarian, tubal, or peritoneal cancers.  Pelvic exam and Pap test. This may be done every 3 years starting at age 76. Starting at age 102, this may be done every 5 years if you have a Pap test in combination with an HPV test. Talk with your health care provider about your test results, treatment options, and if necessary, the need for more tests.  Follow these instructions at home: Eating and drinking   Eat a diet that includes fresh fruits and vegetables, whole grains, lean protein, and low-fat dairy.  Take vitamin and mineral supplements as recommended by your health care provider.  Do not drink alcohol if: ? Your health care provider tells you not to drink. ? You are  pregnant, may be pregnant, or are planning to become pregnant.  If you drink alcohol: ? Limit how much you have to 0-1 drink a day. ? Be aware of how much alcohol is in your drink. In the [place] one drink equals one 12 oz bottle of beer (355 mL), one 5 oz glass of wine (148 mL), or one 1 oz glass of hard liquor (44 mL). Lifestyle  Take daily care of your teeth and gums.  Stay active. Exercise for at least 30 minutes on 5 or more days each week.  Do not use any products that contain nicotine or tobacco, such as cigarettes, e-cigarettes, and chewing tobacco. If you need help quitting, ask your health care provider.  If you are sexually active, practice safe sex. Use a condom or other form of birth control (contraception) in order to prevent pregnancy and STIs (sexually transmitted infections). If you plan to become pregnant, see your health care provider for a preconception visit. What's next?  Visit your health care provider once a year for a well check visit.  Ask your health care provider how often you should have your eyes and teeth checked.  Stay up to date on all vaccines. This information is not intended to replace advice given to you by your health care provider. Make sure you discuss any questions you have with your health care provider. Document Revised: 05/17/2018 Document Reviewed: 05/17/2018 Elsevier Patient Education  2020 Elsevier Inc. Breast Self-Awareness Breast self-awareness is knowing how your breasts look and feel. Doing breast self-awareness is important. It allows you to catch a breast problem early while it is still small and can be treated. All women should do breast self-awareness, including women who have had breast implants. Tell your doctor if you notice a change in your breasts. What you need:  A mirror.  A well-lit room. How to do a breast self-exam A breast self-exam is one way to learn what is normal for your breasts and to check for changes. To do a  breast self-exam: Look for changes  1. Take off all the clothes above your waist. 2. Stand in front of a mirror in a room with good lighting. 3. Put your hands on your hips. 4. Push your hands down. 5. Look at your breasts and nipples in the mirror to see if one breast or nipple looks different from the other. Check to see if: ? The shape of one breast is different. ? The size of one breast is different. ? There are wrinkles, dips, and bumps in one breast and not the other. 6. Look at each breast for changes in the skin, such as: ? Redness. ? Scaly areas. 7. Look for changes in your nipples, such as: ? Liquid around the nipples. ? Bleeding. ? Dimpling. ? Redness. ? A change in where the nipples are. Feel for changes  1. Lie on your back on the floor. 2. Feel each breast. To do this, follow these steps: ? Pick a breast to feel. ? Put the arm closest to that breast above your head. ? Use your other arm to feel the nipple area of your breast. Feel   the area with the pads of your three middle fingers by making small circles with your fingers. For the first circle, press lightly. For the second circle, press harder. For the third circle, press even harder. ? Keep making circles with your fingers at the different pressures as you move down your breast. Stop when you feel your ribs. ? Move your fingers a little toward the center of your body. ? Start making circles with your fingers again, this time going up until you reach your collarbone. ? Keep making up-and-down circles until you reach your armpit. Remember to keep using the three pressures. ? Feel the other breast in the same way. 3. Sit or stand in the tub or shower. 4. With soapy water on your skin, feel each breast the same way you did in step 2 when you were lying on the floor. Write down what you find Writing down what you find can help you remember what to tell your doctor. Write down:  What is normal for each breast.  Any  changes you find in each breast, including: ? The kind of changes you find. ? Whether you have pain. ? Size and location of any lumps.  When you last had your menstrual period. General tips  Check your breasts every month.  If you are breastfeeding, the best time to check your breasts is after you feed your baby or after you use a breast pump.  If you get menstrual periods, the best time to check your breasts is 5-7 days after your menstrual period is over.  With time, you will become comfortable with the self-exam, and you will begin to know if there are changes in your breasts. Contact a doctor if you:  See a change in the shape or size of your breasts or nipples.  See a change in the skin of your breast or nipples, such as red or scaly skin.  Have fluid coming from your nipples that is not normal.  Find a lump or thick area that was not there before.  Have pain in your breasts.  Have any concerns about your breast health. Summary  Breast self-awareness includes looking for changes in your breasts, as well as feeling for changes within your breasts.  Breast self-awareness should be done in front of a mirror in a well-lit room.  You should check your breasts every month. If you get menstrual periods, the best time to check your breasts is 5-7 days after your menstrual period is over.  Let your doctor know of any changes you see in your breasts, including changes in size, changes on the skin, pain or tenderness, or fluid from your nipples that is not normal. This information is not intended to replace advice given to you by your health care provider. Make sure you discuss any questions you have with your health care provider. Document Revised: 04/24/2018 Document Reviewed: 04/24/2018 Elsevier Patient Education  2020 Elsevier Inc.  

## 2020-07-21 ENCOUNTER — Encounter: Payer: Self-pay | Admitting: Obstetrics and Gynecology

## 2020-07-21 ENCOUNTER — Other Ambulatory Visit: Payer: Self-pay

## 2020-07-21 ENCOUNTER — Encounter: Payer: Medicaid Other | Admitting: Obstetrics and Gynecology

## 2020-07-21 ENCOUNTER — Ambulatory Visit (INDEPENDENT_AMBULATORY_CARE_PROVIDER_SITE_OTHER): Payer: BC Managed Care – PPO | Admitting: Obstetrics and Gynecology

## 2020-07-21 VITALS — BP 125/77 | HR 79 | Ht 69.0 in | Wt 280.4 lb

## 2020-07-21 DIAGNOSIS — Z23 Encounter for immunization: Secondary | ICD-10-CM

## 2020-07-21 DIAGNOSIS — E559 Vitamin D deficiency, unspecified: Secondary | ICD-10-CM

## 2020-07-21 DIAGNOSIS — Z9884 Bariatric surgery status: Secondary | ICD-10-CM

## 2020-07-21 DIAGNOSIS — Z131 Encounter for screening for diabetes mellitus: Secondary | ICD-10-CM

## 2020-07-21 DIAGNOSIS — Z6841 Body Mass Index (BMI) 40.0 and over, adult: Secondary | ICD-10-CM

## 2020-07-21 DIAGNOSIS — Z01419 Encounter for gynecological examination (general) (routine) without abnormal findings: Secondary | ICD-10-CM | POA: Diagnosis not present

## 2020-07-21 DIAGNOSIS — Z13220 Encounter for screening for lipoid disorders: Secondary | ICD-10-CM

## 2020-07-21 MED ORDER — NORGESTIMATE-ETH ESTRADIOL 0.25-35 MG-MCG PO TABS: 1.0000 | ORAL_TABLET | Freq: Every day | ORAL | 3 refills | Status: DC

## 2020-07-21 NOTE — Progress Notes (Signed)
GYNECOLOGY ANNUAL PHYSICAL EXAM PROGRESS NOTE  Subjective:    Victoria Gibbs is a 29 y.o. G58P1001 female who presents for an annual exam. The patient has no complaints today. The patient is sexually active. The patient wears seatbelts: yes. The patient participates in regular exercise: yes. Has the patient ever been transfused or tattooed?: no. The patient reports that there is not domestic violence in her life. She states over the past few months, she has noticed increasing dysmenorrhea for which she uses Midol 500 mg 3-4x/day.   Of note, patient had bariatric surgery at [facility] last year, and is down 28.6 lbs.She is also, actively looking to establish care with a PCP.   Gynecologic History  Menarche age: 41 Patient's last menstrual period was 06/29/2020. Cycles last 4-5 days, moderate. Accompanied with mild to moderate dysmenorrhea managed by Midol.  Contraception: oral progesterone-only contraceptive History of STI's: Trichomonas, treated in 11/2018.  Last Pap: 05/2018. Results were: normal.  Denies h/o abnormal pap smears.    OB History  Gravida Para Term Preterm AB Living  1 1 1  0 0 1  SAB TAB Ectopic Multiple Live Births  0 0 0 0 1    # Outcome Date GA Lbr Len/2nd Weight Sex Delivery Anes PTL Lv  1 Term 01/23/16 [redacted]w[redacted]d 09:22 / 01:48 7 lb 8.3 oz (3.41 kg) M Vag-Forceps EPI  LIV     Birth Comments: couple of tiny nevus on right rib-cage area     Name: Hopkin,BOY Latrelle     Apgar1: 8  Apgar5: 9    Past Medical History:  Diagnosis Date  . Hidradenitis suppurativa   . History of trichomoniasis 12/04/2018  . Hypertension   . Obesity     Past Surgical History:  Procedure Laterality Date  . SLEEVE GASTROPLASTY      Family History  Problem Relation Age of Onset  . Hypertension Mother   . Diabetes Brother   . Healthy Father   . Breast cancer Neg Hx   . Ovarian cancer Neg Hx   . Colon cancer Neg Hx     Social History   Socioeconomic History  . Marital status:  Single    Spouse name: Not on file  . Number of children: Not on file  . Years of education: Not on file  . Highest education level: Not on file  Occupational History  . Not on file  Tobacco Use  . Smoking status: Never Smoker  . Smokeless tobacco: Never Used  Vaping Use  . Vaping Use: Never used  Substance and Sexual Activity  . Alcohol use: Yes    Comment: occasional  . Drug use: No  . Sexual activity: Yes    Birth control/protection: Pill  Other Topics Concern  . Not on file  Social History Narrative  . Not on file   Social Determinants of Health   Financial Resource Strain:   . Difficulty of Paying Living Expenses: Not on file  Food Insecurity:   . Worried About 12/06/2018 in the Last Year: Not on file  . Ran Out of Food in the Last Year: Not on file  Transportation Needs:   . Lack of Transportation (Medical): Not on file  . Lack of Transportation (Non-Medical): Not on file  Physical Activity:   . Days of Exercise per Week: Not on file  . Minutes of Exercise per Session: Not on file  Stress:   . Feeling of Stress : Not on file  Social Connections:   . Frequency of Communication with Friends and Family: Not on file  . Frequency of Social Gatherings with Friends and Family: Not on file  . Attends Religious Services: Not on file  . Active Member of Clubs or Organizations: Not on file  . Attends Banker Meetings: Not on file  . Marital Status: Not on file  Intimate Partner Violence:   . Fear of Current or Ex-Partner: Not on file  . Emotionally Abused: Not on file  . Physically Abused: Not on file  . Sexually Abused: Not on file    Current Outpatient Medications on File Prior to Visit  Medication Sig Dispense Refill  . CALCIUM PO Take by mouth.    . Multiple Vitamin (MULTI-VITAMIN DAILY PO) Take by mouth.    . Norethindrone Acetate-Ethinyl Estradiol (MICROGESTIN) 1.5-30 MG-MCG tablet Take 1 tablet by mouth daily. 30 tablet 0   No current  facility-administered medications on file prior to visit.    No Known Allergies    Review of Systems Constitutional: negative for chills, fatigue, fevers and sweats Eyes: negative for irritation, redness and visual disturbance Ears, nose, mouth, throat, and face: negative for hearing loss, nasal congestion, snoring and tinnitus Respiratory: negative for asthma, cough, sputum Cardiovascular: negative for chest pain, dyspnea, exertional chest pressure/discomfort, irregular heart beat, palpitations and syncope Gastrointestinal: negative for abdominal pain, change in bowel habits, nausea and vomiting Genitourinary: negative for abnormal menstrual periods, genital lesions, sexual problems and vaginal discharge, dysuria and urinary incontinence Integument/breast: negative for breast lump, breast tenderness and nipple discharge.  Hematologic/lymphatic: negative for bleeding and easy bruising Musculoskeletal:negative for back pain and muscle weakness Neurological: negative for dizziness, headaches, vertigo and weakness Endocrine: negative for diabetic symptoms including polydipsia, polyuria and skin dryness Allergic/Immunologic: negative for hay fever and urticaria        Objective:  Blood pressure 125/77, pulse 79, height 5\' 9"  (1.753 m), weight 280 lb 6.4 oz (127.2 kg), last menstrual period 06/29/2020. Body mass index is 41.41 kg/m.  General Appearance:    Alert, cooperative, no distress, appears stated age, morbidly obese  Head:    Normocephalic, without obvious abnormality, atraumatic  Eyes:    PERRL, conjunctiva/corneas clear, EOM's intact, both eyes  Ears:    Normal external ear canals, both ears  Nose:   Nares normal, septum midline, mucosa normal, no drainage or sinus tenderness  Throat:   Lips, mucosa, and tongue normal; teeth and gums normal  Neck:   Supple, symmetrical, trachea midline, no adenopathy; thyroid: no enlargement/tenderness/nodules; no carotid bruit or JVD  Back:      Symmetric, no curvature, ROM normal, no CVA tenderness  Lungs:     Clear to auscultation bilaterally, respirations unlabored  Chest Wall:    No tenderness or deformity   Heart:    Regular rate and rhythm, S1 and S2 normal, no murmur, rub or gallop  Breast Exam:    No tenderness, masses, or nipple abnormality  Abdomen:     Soft, non-tender, bowel sounds active all four quadrants, no masses, no organomegaly.    Genitalia:    Pelvic:external genitalia normal, vagina without lesions, discharge, or tenderness, rectovaginal septum  normal. Cervix normal in appearance, no cervical motion tenderness, no adnexal masses or tenderness.  Uterus normal size, shape, mobile, regular contours, nontender.  Rectal:    Normal external sphincter.  No hemorrhoids appreciated. Internal exam not done.   Extremities:   Extremities normal, atraumatic, no cyanosis or edema  Pulses:  2+ and symmetric all extremities  Skin:   Skin color, texture, turgor normal,  Axillary and pannus regions with scarring, hyperkeratosis.   Lymph nodes:   Cervical, supraclavicular, and axillary nodes normal  Neurologic:   CNII-XII intact, normal strength, sensation and reflexes throughout   .  Labs:  Lab Results  Component Value Date   WBC 6.3 07/16/2018   HGB 11.5 07/16/2018   HCT 34.2 07/16/2018   MCV 83 07/16/2018   PLT 368 07/16/2018    Lab Results  Component Value Date   CREATININE 0.82 07/16/2018   BUN 10 07/16/2018   NA 138 07/16/2018   K 4.2 07/16/2018   CL 101 07/16/2018   CO2 22 07/16/2018    Lab Results  Component Value Date   ALT 15 06/21/2018   AST 11 06/21/2018   ALKPHOS 61 06/21/2018   BILITOT <0.2 06/21/2018    Lab Results  Component Value Date   TSH 2.380 07/16/2018    Labs reviewed in Care Everywhere ([facility]), from 09/2018 and 05/2019.   Assessment:   1. Encounter for well woman exam with routine gynecological exam   2. History of gastric bypass   3. Flu vaccine need   4. Morbid obesity with  BMI of 40.0-44.9, adult (HCC)   5. Vitamin D deficiency   6. Screening for diabetes mellitus   7. Screening for lipid disorders     Plan:    Blood tests: Ordered: Vitamin B12, Mg2+ and Vitamin D. All other labs reviewed in [facility] Every[facility] ([facility]), performed 05/2020.  Contraception: Increased OCP dose to 0.25 - 0.35, for better control.  Breast self exam technique reviewed and patient encouraged to perform self-exam monthly. Discussed healthy lifestyle modifications. Continue to encourage healthy weight loss s/p gastric bypass.  Pap smear up to date.   Notes hydradenitis has improved, notes a decrease in flares. Continue current management.  Flu vaccine: up to date, unsure of exact date will send MyChart message COVID vaccine: up to date- Moderna RTC in 1 year for annual exam.     I have seen and examined the patient with Richardean Chimera, [place] PA-S.  I have reviewed the record and concur with patient management and plan.   Hildred Laser, MD [facility]   Hildred Laser, MD [facility]

## 2020-07-22 LAB — VITAMIN B12: Vitamin B-12: 700 pg/mL (ref 232–1245)

## 2020-07-22 LAB — MAGNESIUM: Magnesium: 2 mg/dL (ref 1.6–2.3)

## 2020-07-22 LAB — VITAMIN D 25 HYDROXY (VIT D DEFICIENCY, FRACTURES): Vit D, 25-Hydroxy: 53 ng/mL (ref 30.0–100.0)

## 2020-09-15 DIAGNOSIS — Z20822 Contact with and (suspected) exposure to covid-19: Secondary | ICD-10-CM | POA: Diagnosis not present

## 2020-09-15 DIAGNOSIS — U071 COVID-19: Secondary | ICD-10-CM | POA: Diagnosis not present

## 2020-09-21 DIAGNOSIS — U071 COVID-19: Secondary | ICD-10-CM | POA: Diagnosis not present

## 2021-07-27 NOTE — Patient Instructions (Signed)
Breast Self-Awareness Breast self-awareness is knowing how your breasts look and feel. Doing breast self-awareness is important. It allows you to catch a breast problem early while it is still small and can be treated. All women should do breast self-awareness, including women who have had breast implants. Tell your doctor if you notice a change in your breasts. What you need: A mirror. A well-lit room. How to do a breast self-exam A breast self-exam is one way to learn what is normal for your breasts and to check for changes. To do a breast self-exam: Look for changes  Take off all the clothes above your waist. Stand in front of a mirror in a room with good lighting. Put your hands on your hips. Push your hands down. Look at your breasts and nipples in the mirror to see if one breast or nipple looks different from the other. Check to see if: The shape of one breast is different. The size of one breast is different. There are wrinkles, dips, and bumps in one breast and not the other. Look at each breast for changes in the skin, such as: Redness. Scaly areas. Look for changes in your nipples, such as: Liquid around the nipples. Bleeding. Dimpling. Redness. A change in where the nipples are. Feel for changes  Lie on your back on the floor. Feel each breast. To do this, follow these steps: Pick a breast to feel. Put the arm closest to that breast above your head. Use your other arm to feel the nipple area of your breast. Feel the area with the pads of your three middle fingers by making small circles with your fingers. For the first circle, press lightly. For the second circle, press harder. For the third circle, press even harder. Keep making circles with your fingers at the different pressures as you move down your breast. Stop when you feel your ribs. Move your fingers a little toward the center of your body. Start making circles with your fingers again, this time going up until  you reach your collarbone. Keep making up-and-down circles until you reach your armpit. Remember to keep using the three pressures. Feel the other breast in the same way. Sit or stand in the tub or shower. With soapy water on your skin, feel each breast the same way you did in step 2 when you were lying on the floor. Write down what you find Writing down what you find can help you remember what to tell your doctor. Write down: What is normal for each breast. Any changes you find in each breast, including: The kind of changes you find. Whether you have pain. Size and location of any lumps. When you last had your menstrual period. General tips Check your breasts every month. If you are breastfeeding, the best time to check your breasts is after you feed your baby or after you use a breast pump. If you get menstrual periods, the best time to check your breasts is 5-7 days after your menstrual period is over. With time, you will become comfortable with the self-exam, and you will begin to know if there are changes in your breasts. Contact a doctor if you: See a change in the shape or size of your breasts or nipples. See a change in the skin of your breast or nipples, such as red or scaly skin. Have fluid coming from your nipples that is not normal. Find a lump or thick area that was not there before. Have pain in   your breasts. °Have any concerns about your breast health. °Summary °Breast self-awareness includes looking for changes in your breasts, as well as feeling for changes within your breasts. °Breast self-awareness should be done in front of a mirror in a well-lit room. °You should check your breasts every month. If you get menstrual periods, the best time to check your breasts is 5-7 days after your menstrual period is over. °Let your doctor know of any changes you see in your breasts, including changes in size, changes on the skin, pain or tenderness, or fluid from your nipples that is not  normal. °This information is not intended to replace advice given to you by your health care provider. Make sure you discuss any questions you have with your health care provider. °Document Revised: 04/24/2018 Document Reviewed: 04/24/2018 °Elsevier Patient Education © 2022 [employer] °Preventive Care 21-39 Years Old, Female °Preventive care refers to lifestyle choices and visits with your health care provider that can promote health and wellness. Preventive care visits are also called wellness exams. °What can I expect for my preventive care visit? °Counseling °During your preventive care visit, your health care provider may ask about your: °Medical history, including: °Past medical problems. °Family medical history. °Pregnancy history. °Current health, including: °Menstrual cycle. °Method of birth control. °Emotional well-being. °Home life and relationship well-being. °Sexual activity and sexual health. °Lifestyle, including: °Alcohol, nicotine or tobacco, and drug use. °Access to firearms. °Diet, exercise, and sleep habits. °Work and work environment. °Sunscreen use. °Safety issues such as seatbelt and bike helmet use. °Physical exam °Your health care provider may check your: °Height and weight. These may be used to calculate your BMI (body mass index). BMI is a measurement that tells if you are at a healthy weight. °Waist circumference. This measures the distance around your waistline. This measurement also tells if you are at a healthy weight and may help predict your risk of certain diseases, such as type 2 diabetes and high blood pressure. °Heart rate and blood pressure. °Body temperature. °Skin for abnormal spots. °What immunizations do I need? °Vaccines are usually given at various ages, according to a schedule. Your health care provider will recommend vaccines for you based on your age, medical history, and lifestyle or other factors, such as travel or where you work. °What tests do I  need? °Screening °Your health care provider may recommend screening tests for certain conditions. This may include: °Pelvic exam and Pap test. °Lipid and cholesterol levels. °Diabetes screening. This is done by checking your blood sugar (glucose) after you have not eaten for a while (fasting). °Hepatitis B test. °Hepatitis C test. °HIV (human immunodeficiency virus) test. °STI (sexually transmitted infection) testing, if you are at risk. °BRCA-related cancer screening. This may be done if you have a family history of breast, ovarian, tubal, or peritoneal cancers. °Talk with your health care provider about your test results, treatment options, and if necessary, the need for more tests. °Follow these instructions at home: °Eating and drinking ° °Eat a healthy diet that includes fresh fruits and vegetables, whole grains, lean protein, and low-fat dairy products. °Take vitamin and mineral supplements as recommended by your health care provider. °Do not drink alcohol if: °Your health care provider tells you not to drink. °You are pregnant, may be pregnant, or are planning to become pregnant. °If you drink alcohol: °Limit how much you have to 0-1 drink a day. °Know how much alcohol is in your drink. In the [place] one drink equals one 12 oz   bottle of beer (355 mL), one 5 oz glass of wine (148 mL), or one 1½ oz glass of hard liquor (44 mL). °Lifestyle °Brush your teeth every morning and night with fluoride toothpaste. Floss one time each day. °Exercise for at least 30 minutes 5 or more days each week. °Do not use any products that contain nicotine or tobacco. These products include cigarettes, chewing tobacco, and vaping devices, such as e-cigarettes. If you need help quitting, ask your health care provider. °Do not use drugs. °If you are sexually active, practice safe sex. Use a condom or other form of protection to prevent STIs. °If you do not wish to become pregnant, use a form of birth control. If you plan to become  pregnant, see your health care provider for a prepregnancy visit. °Find healthy ways to manage stress, such as: °Meditation, yoga, or listening to music. °Journaling. °Talking to a trusted person. °Spending time with friends and family. °Minimize exposure to UV radiation to reduce your risk of skin cancer. °Safety °Always wear your seat belt while driving or riding in a vehicle. °Do not drive: °If you have been drinking alcohol. Do not ride with someone who has been drinking. °If you have been using any mind-altering substances or drugs. °While texting. °When you are tired or distracted. °Wear a helmet and other protective equipment during sports activities. °If you have firearms in your house, make sure you follow all gun safety procedures. °Seek help if you have been physically or sexually abused. °What's next? °Go to your health care provider once a year for an annual wellness visit. °Ask your health care provider how often you should have your eyes and teeth checked. °Stay up to date on all vaccines. °This information is not intended to replace advice given to you by your health care provider. Make sure you discuss any questions you have with your health care provider. °Document Revised: 03/03/2021 Document Reviewed: 03/03/2021 °Elsevier Patient Education © 2022 [employer] Inc. ° °

## 2021-07-28 ENCOUNTER — Ambulatory Visit: Payer: BC Managed Care – PPO | Admitting: Obstetrics and Gynecology

## 2021-07-28 ENCOUNTER — Other Ambulatory Visit: Payer: Self-pay

## 2021-07-28 ENCOUNTER — Encounter: Payer: Self-pay | Admitting: Obstetrics and Gynecology

## 2021-07-28 DIAGNOSIS — Z9884 Bariatric surgery status: Secondary | ICD-10-CM

## 2021-07-28 DIAGNOSIS — E559 Vitamin D deficiency, unspecified: Secondary | ICD-10-CM

## 2021-07-28 DIAGNOSIS — Z01419 Encounter for gynecological examination (general) (routine) without abnormal findings: Secondary | ICD-10-CM

## 2021-07-28 NOTE — Progress Notes (Unsigned)
GYNECOLOGY ANNUAL PHYSICAL EXAM PROGRESS NOTE  Subjective:    Victoria Gibbs is a 30 y.o. G71P1001 female who presents for an annual exam. The patient has no complaints today. The patient is sexually active. The patient wears seatbelts: yes. The patient participates in regular exercise: yes. Has the patient ever been transfused or tattooed?: no. The patient reports that there is not domestic violence in her life.    Gynecologic History  Menarche age: 59 Patient's last menstrual period was 05/29/2021 (exact date). Cycles last 4-5 days, moderate. Accompanied with mild to moderate dysmenorrhea managed by [facility].  Contraception: oral progesterone-only contraceptive History of STI's: Trichomonas, treated in 11/2018.  Last Pap: 02/19/2018. Results were: normal.  Denies h/o abnormal pap smears.    OB History  Gravida Para Term Preterm AB Living  1 1 1  0 0 1  SAB IAB Ectopic Multiple Live Births  0 0 0 0 1    # Outcome Date GA Lbr Len/2nd Weight Sex Delivery Anes PTL Lv  1 Term 01/23/16 [redacted]w[redacted]d 09:22 / 01:48 7 lb 8.3 oz (3.41 kg) M Vag-Forceps EPI  LIV     Birth Comments: couple of tiny nevus on right rib-cage area     Name: Hussein,BOY Leahmarie     Apgar1: 8  Apgar5: 9    Past Medical History:  Diagnosis Date   Hidradenitis suppurativa    History of trichomoniasis 12/04/2018   Hypertension    Obesity     Past Surgical History:  Procedure Laterality Date   SLEEVE GASTROPLASTY      Family History  Problem Relation Age of Onset   Hypertension Mother    Diabetes Brother    Healthy Father    Breast cancer Neg Hx    Ovarian cancer Neg Hx    Colon cancer Neg Hx     Social History   Socioeconomic History   Marital status: Single    Spouse name: Not on file   Number of children: Not on file   Years of education: Not on file   Highest education level: Not on file  Occupational History   Not on file  Tobacco Use   Smoking status: Never   Smokeless tobacco: Never  Vaping  Use   Vaping Use: Never used  Substance and Sexual Activity   Alcohol use: Yes    Comment: occasional   Drug use: No   Sexual activity: Yes    Birth control/protection: Pill  Other Topics Concern   Not on file  Social History Narrative   Not on file   Social Determinants of Health   Financial Resource Strain: Not on file  Food Insecurity: Not on file  Transportation Needs: Not on file  Physical Activity: Not on file  Stress: Not on file  Social Connections: Not on file  Intimate Partner Violence: Not on file    Current Outpatient Medications on File Prior to Visit  Medication Sig Dispense Refill   CALCIUM PO Take by mouth.     Multiple Vitamin (MULTI-VITAMIN DAILY PO) Take by mouth.     norgestimate-ethinyl estradiol (ORTHO-CYCLEN) 0.25-35 MG-MCG tablet Take 1 tablet by mouth daily. 84 tablet 3   No current facility-administered medications on file prior to visit.    No Known Allergies    Review of Systems Constitutional: negative for chills, fatigue, fevers and sweats Eyes: negative for irritation, redness and visual disturbance Ears, nose, mouth, throat, and face: negative for hearing loss, nasal congestion, snoring and tinnitus Respiratory:  negative for asthma, cough, sputum Cardiovascular: negative for chest pain, dyspnea, exertional chest pressure/discomfort, irregular heart beat, palpitations and syncope Gastrointestinal: negative for abdominal pain, change in bowel habits, nausea and vomiting Genitourinary: negative for abnormal menstrual periods, genital lesions, sexual problems and vaginal discharge, dysuria and urinary incontinence Integument/breast: negative for breast lump, breast tenderness and nipple discharge.  Hematologic/lymphatic: negative for bleeding and easy bruising Musculoskeletal:negative for back pain and muscle weakness Neurological: negative for dizziness, headaches, vertigo and weakness Endocrine: negative for diabetic symptoms including  polydipsia, polyuria and skin dryness Allergic/Immunologic: negative for hay fever and urticaria        Objective:  Blood pressure 124/75, pulse 77, height 5\' 9"  (1.753 m), weight 279 lb 3.2 oz (126.6 kg), last menstrual period 05/29/2021. Body mass index is 41.23 kg/m.  General Appearance:    Alert, cooperative, no distress, appears stated age, morbidly obese  Head:    Normocephalic, without obvious abnormality, atraumatic  Eyes:    PERRL, conjunctiva/corneas clear, EOM's intact, both eyes  Ears:    Normal external ear canals, both ears  Nose:   Nares normal, septum midline, mucosa normal, no drainage or sinus tenderness  Throat:   Lips, mucosa, and tongue normal; teeth and gums normal  Neck:   Supple, symmetrical, trachea midline, no adenopathy; thyroid: no enlargement/tenderness/nodules; no carotid bruit or JVD  Back:     Symmetric, no curvature, ROM normal, no CVA tenderness  Lungs:     Clear to auscultation bilaterally, respirations unlabored  Chest Wall:    No tenderness or deformity   Heart:    Regular rate and rhythm, S1 and S2 normal, no murmur, rub or gallop  Breast Exam:    No tenderness, masses, or nipple abnormality  Abdomen:     Soft, non-tender, bowel sounds active all four quadrants, no masses, no organomegaly.    Genitalia:    Pelvic:external genitalia normal, vagina without lesions, discharge, or tenderness, rectovaginal septum  normal. Cervix normal in appearance, no cervical motion tenderness, no adnexal masses or tenderness.  Uterus normal size, shape, mobile, regular contours, nontender.  Rectal:    Normal external sphincter.  No hemorrhoids appreciated. Internal exam not done.   Extremities:   Extremities normal, atraumatic, no cyanosis or edema  Pulses:   2+ and symmetric all extremities  Skin:   Skin color, texture, turgor normal,  Axillary and pannus regions with scarring, hyperkeratosis.   Lymph nodes:   Cervical, supraclavicular, and axillary nodes normal   Neurologic:   CNII-XII intact, normal strength, sensation and reflexes throughout   .  Labs:  Lab Results  Component Value Date   WBC 6.3 07/16/2018   HGB 11.5 07/16/2018   HCT 34.2 07/16/2018   MCV 83 07/16/2018   PLT 368 07/16/2018    Lab Results  Component Value Date   CREATININE 0.82 07/16/2018   BUN 10 07/16/2018   NA 138 07/16/2018   K 4.2 07/16/2018   CL 101 07/16/2018   CO2 22 07/16/2018    Lab Results  Component Value Date   ALT 15 06/21/2018   AST 11 06/21/2018   ALKPHOS 61 06/21/2018   BILITOT <0.2 06/21/2018    Lab Results  Component Value Date   TSH 2.380 07/16/2018    Labs reviewed in [place] ([place]), from 09/2018 and 05/2019.   Assessment:   1. Encounter for well woman exam with routine gynecological exam   2. History of gastric bypass   3. Vitamin D deficiency   4.  Morbid obesity with BMI of 40.0-44.9, adult ([place])     Plan:    Blood tests: Ordered: TSH, HgbA1C, CBC, CMP, Vit D.  Contraception: Increased OCP dose to 0.25 - 0.35, for better control.  Breast self exam technique reviewed and patient encouraged to perform self-exam monthly. Discussed healthy lifestyle modifications. Continue to encourage healthy weight loss s/p gastric bypass.  Pap smear completed during visit.   Notes hydradenitis has improved, notes a decrease in flares. Continue current management.  Flu vaccine: up to date, unsure of exact date will send MyChart message COVID vaccine: up to date- 2 vaccines of Moderna Flu vaccine: 07/28/2021; completed during visit. RTC in 1 year for annual exam.       Edwyna Shell, LPN [facility]

## 2021-08-18 ENCOUNTER — Encounter: Payer: Self-pay | Admitting: Obstetrics and Gynecology

## 2021-08-18 ENCOUNTER — Other Ambulatory Visit: Payer: Self-pay

## 2021-08-18 ENCOUNTER — Ambulatory Visit (INDEPENDENT_AMBULATORY_CARE_PROVIDER_SITE_OTHER): Payer: BC Managed Care – PPO | Admitting: Obstetrics and Gynecology

## 2021-08-18 ENCOUNTER — Other Ambulatory Visit (HOSPITAL_COMMUNITY)
Admission: RE | Admit: 2021-08-18 | Discharge: 2021-08-18 | Disposition: A | Discharge status: Home / Self Care | Payer: No Typology Code available for payment source | Source: Ambulatory Visit | Attending: Obstetrics and Gynecology | Admitting: Obstetrics and Gynecology

## 2021-08-18 VITALS — BP 102/66 | HR 74 | Resp 16 | Ht 69.0 in | Wt 281.2 lb

## 2021-08-18 DIAGNOSIS — M7121 Synovial cyst of popliteal space [Baker], right knee: Secondary | ICD-10-CM

## 2021-08-18 DIAGNOSIS — L732 Hidradenitis suppurativa: Secondary | ICD-10-CM

## 2021-08-18 DIAGNOSIS — Z124 Encounter for screening for malignant neoplasm of cervix: Secondary | ICD-10-CM

## 2021-08-18 DIAGNOSIS — Z01419 Encounter for gynecological examination (general) (routine) without abnormal findings: Secondary | ICD-10-CM | POA: Insufficient documentation

## 2021-08-18 DIAGNOSIS — Z9884 Bariatric surgery status: Secondary | ICD-10-CM | POA: Diagnosis not present

## 2021-08-18 DIAGNOSIS — Z6841 Body Mass Index (BMI) 40.0 and over, adult: Secondary | ICD-10-CM

## 2021-08-18 NOTE — Patient Instructions (Incomplete)
Breast Self-Awareness Breast self-awareness is knowing how your breasts look and feel. Doing breast self-awareness is important. It allows you to catch a breast problem early while it is still small and can be treated. All women should do breast self-awareness, including women who have had breast implants. Tell your doctor if you notice a change in your breasts. What you need: A mirror. A well-lit room. How to do a breast self-exam A breast self-exam is one way to learn what is normal for your breasts and to check for changes. To do a breast self-exam: Look for changes  Take off all the clothes above your waist. Stand in front of a mirror in a room with good lighting. Put your hands on your hips. Push your hands down. Look at your breasts and nipples in the mirror to see if one breast or nipple looks different from the other. Check to see if: The shape of one breast is different. The size of one breast is different. There are wrinkles, dips, and bumps in one breast and not the other. Look at each breast for changes in the skin, such as: Redness. Scaly areas. Look for changes in your nipples, such as: Liquid around the nipples. Bleeding. Dimpling. Redness. A change in where the nipples are. Feel for changes  Lie on your back on the floor. Feel each breast. To do this, follow these steps: Pick a breast to feel. Put the arm closest to that breast above your head. Use your other arm to feel the nipple area of your breast. Feel the area with the pads of your three middle fingers by making small circles with your fingers. For the first circle, press lightly. For the second circle, press harder. For the third circle, press even harder. Keep making circles with your fingers at the different pressures as you move down your breast. Stop when you feel your ribs. Move your fingers a little toward the center of your body. Start making circles with your fingers again, this time going up until  you reach your collarbone. Keep making up-and-down circles until you reach your armpit. Remember to keep using the three pressures. Feel the other breast in the same way. Sit or stand in the tub or shower. With soapy water on your skin, feel each breast the same way you did in step 2 when you were lying on the floor. Write down what you find Writing down what you find can help you remember what to tell your doctor. Write down: What is normal for each breast. Any changes you find in each breast, including: The kind of changes you find. Whether you have pain. Size and location of any lumps. When you last had your menstrual period. General tips Check your breasts every month. If you are breastfeeding, the best time to check your breasts is after you feed your baby or after you use a breast pump. If you get menstrual periods, the best time to check your breasts is 5-7 days after your menstrual period is over. With time, you will become comfortable with the self-exam, and you will begin to know if there are changes in your breasts. Contact a doctor if you: See a change in the shape or size of your breasts or nipples. See a change in the skin of your breast or nipples, such as red or scaly skin. Have fluid coming from your nipples that is not normal. Find a lump or thick area that was not there before. Have pain in   your breasts. °Have any concerns about your breast health. °Summary °Breast self-awareness includes looking for changes in your breasts, as well as feeling for changes within your breasts. °Breast self-awareness should be done in front of a mirror in a well-lit room. °You should check your breasts every month. If you get menstrual periods, the best time to check your breasts is 5-7 days after your menstrual period is over. °Let your doctor know of any changes you see in your breasts, including changes in size, changes on the skin, pain or tenderness, or fluid from your nipples that is not  normal. °This information is not intended to replace advice given to you by your health care provider. Make sure you discuss any questions you have with your health care provider. °Document Revised: 04/24/2018 Document Reviewed: 04/24/2018 °Elsevier Patient Education © 2022 [employer] °Preventive Care 21-39 Years Old, Female °Preventive care refers to lifestyle choices and visits with your health care provider that can promote health and wellness. Preventive care visits are also called wellness exams. °What can I expect for my preventive care visit? °Counseling °During your preventive care visit, your health care provider may ask about your: °Medical history, including: °Past medical problems. °Family medical history. °Pregnancy history. °Current health, including: °Menstrual cycle. °Method of birth control. °Emotional well-being. °Home life and relationship well-being. °Sexual activity and sexual health. °Lifestyle, including: °Alcohol, nicotine or tobacco, and drug use. °Access to firearms. °Diet, exercise, and sleep habits. °Work and work environment. °Sunscreen use. °Safety issues such as seatbelt and bike helmet use. °Physical exam °Your health care provider may check your: °Height and weight. These may be used to calculate your BMI (body mass index). BMI is a measurement that tells if you are at a healthy weight. °Waist circumference. This measures the distance around your waistline. This measurement also tells if you are at a healthy weight and may help predict your risk of certain diseases, such as type 2 diabetes and high blood pressure. °Heart rate and blood pressure. °Body temperature. °Skin for abnormal spots. °What immunizations do I need? °Vaccines are usually given at various ages, according to a schedule. Your health care provider will recommend vaccines for you based on your age, medical history, and lifestyle or other factors, such as travel or where you work. °What tests do I  need? °Screening °Your health care provider may recommend screening tests for certain conditions. This may include: °Pelvic exam and Pap test. °Lipid and cholesterol levels. °Diabetes screening. This is done by checking your blood sugar (glucose) after you have not eaten for a while (fasting). °Hepatitis B test. °Hepatitis C test. °HIV (human immunodeficiency virus) test. °STI (sexually transmitted infection) testing, if you are at risk. °BRCA-related cancer screening. This may be done if you have a family history of breast, ovarian, tubal, or peritoneal cancers. °Talk with your health care provider about your test results, treatment options, and if necessary, the need for more tests. °Follow these instructions at home: °Eating and drinking ° °Eat a healthy diet that includes fresh fruits and vegetables, whole grains, lean protein, and low-fat dairy products. °Take vitamin and mineral supplements as recommended by your health care provider. °Do not drink alcohol if: °Your health care provider tells you not to drink. °You are pregnant, may be pregnant, or are planning to become pregnant. °If you drink alcohol: °Limit how much you have to 0-1 drink a day. °Know how much alcohol is in your drink. In the [place] one drink equals one 12 oz   bottle of beer (355 mL), one 5 oz glass of wine (148 mL), or one 1½ oz glass of hard liquor (44 mL). °Lifestyle °Brush your teeth every morning and night with fluoride toothpaste. Floss one time each day. °Exercise for at least 30 minutes 5 or more days each week. °Do not use any products that contain nicotine or tobacco. These products include cigarettes, chewing tobacco, and vaping devices, such as e-cigarettes. If you need help quitting, ask your health care provider. °Do not use drugs. °If you are sexually active, practice safe sex. Use a condom or other form of protection to prevent STIs. °If you do not wish to become pregnant, use a form of birth control. If you plan to become  pregnant, see your health care provider for a prepregnancy visit. °Find healthy ways to manage stress, such as: °Meditation, yoga, or listening to music. °Journaling. °Talking to a trusted person. °Spending time with friends and family. °Minimize exposure to UV radiation to reduce your risk of skin cancer. °Safety °Always wear your seat belt while driving or riding in a vehicle. °Do not drive: °If you have been drinking alcohol. Do not ride with someone who has been drinking. °If you have been using any mind-altering substances or drugs. °While texting. °When you are tired or distracted. °Wear a helmet and other protective equipment during sports activities. °If you have firearms in your house, make sure you follow all gun safety procedures. °Seek help if you have been physically or sexually abused. °What's next? °Go to your health care provider once a year for an annual wellness visit. °Ask your health care provider how often you should have your eyes and teeth checked. °Stay up to date on all vaccines. °This information is not intended to replace advice given to you by your health care provider. Make sure you discuss any questions you have with your health care provider. °Document Revised: 03/03/2021 Document Reviewed: 03/03/2021 °Elsevier Patient Education © 2022 [employer] Inc. ° °

## 2021-08-18 NOTE — Progress Notes (Signed)
GYNECOLOGY ANNUAL PHYSICAL EXAM PROGRESS NOTE  Subjective:    Victoria Gibbs is a 30 y.o. G24P1001 female who presents for an annual exam.  The patient is sexually active. The patient wears seatbelts: yes. The patient participates in regular exercise: yes. Has the patient ever been transfused or tattooed?: no. The patient reports that there is not domestic violence in her life.   The patient has the following complaints today.  She would like a referral to dermatology for some issues that she has going on with her skin (has h/o hydradenitis).  Also noting a lump on her right wrist, has been present for 1.5 months, slowly growing, non-painful.   Menstrual History: Menarche age: 80 No LMP recorded. (Menstrual status: Oral contraceptives).  Period Duration (Days): 5 Period Pattern: (!) Irregular Menstrual Flow: Moderate Menstrual Control: Panty liner, Tampon Menstrual Control Change Freq (Hours): 2-3 Dysmenorrhea: (!) Moderate Dysmenorrhea Symptoms: Cramping, Throbbing, Headache   Gynecologic History:  Contraception: OCP (estrogen/progesterone) History of STI's: Trichomonas, treated in 11/2018.  Last Pap: 05/2018. Results were: normal.  Denies h/o abnormal pap smears. Last mammogram: Not age appropriate.     Upstream - 08/18/21 1030       Pregnancy Intention Screening   Does the patient want to become pregnant in the next year? No    Does the patient's partner want to become pregnant in the next year? No    Would the patient like to discuss contraceptive options today? No      Contraception Wrap Up   Current Method Oral Contraceptive    End Method Oral Contraceptive    Contraception Counseling Provided No            The pregnancy intention screening data noted above was reviewed. Potential methods of contraception were discussed. The patient elected to proceed with Oral Contraceptive.    OB History  Gravida Para Term Preterm AB Living  1 1 1  0 0 1  SAB IAB  Ectopic Multiple Live Births  0 0 0 0 1    # Outcome Date GA Lbr Len/2nd Weight Sex Delivery Anes PTL Lv  1 Term 01/23/16 [redacted]w[redacted]d 09:22 / 01:48 7 lb 8.3 oz (3.41 kg) M Vag-Forceps EPI  LIV     Birth Comments: couple of tiny nevus on right rib-cage area     Name: Cogle,BOY Aireona     Apgar1: 8  Apgar5: 9    Past Medical History:  Diagnosis Date   Hidradenitis suppurativa    History of trichomoniasis 12/04/2018   Hypertension    Obesity     Past Surgical History:  Procedure Laterality Date   SLEEVE GASTROPLASTY      Family History  Problem Relation Age of Onset   Hypertension Mother    Diabetes Brother    Healthy Father    Breast cancer Neg Hx    Ovarian cancer Neg Hx    Colon cancer Neg Hx     Social History   Socioeconomic History   Marital status: Single    Spouse name: Not on file   Number of children: Not on file   Years of education: Not on file   Highest education level: Not on file  Occupational History   Not on file  Tobacco Use   Smoking status: Never   Smokeless tobacco: Never  Vaping Use   Vaping Use: Never used  Substance and Sexual Activity   Alcohol use: Yes    Comment: occasional   Drug use: No  Sexual activity: Yes    Birth control/protection: Pill  Other Topics Concern   Not on file  Social History Narrative   Not on file   Social Determinants of Health   Financial Resource Strain: Not on file  Food Insecurity: Not on file  Transportation Needs: Not on file  Physical Activity: Not on file  Stress: Not on file  Social Connections: Not on file  Intimate Partner Violence: Not on file    Current Outpatient Medications on File Prior to Visit  Medication Sig Dispense Refill   CALCIUM PO Take by mouth.     Multiple Vitamin (MULTI-VITAMIN DAILY PO) Take by mouth.     norgestimate-ethinyl estradiol (ORTHO-CYCLEN) 0.25-35 MG-MCG tablet Take 1 tablet by mouth daily. 84 tablet 3   No current facility-administered medications on file  prior to visit.    No Known Allergies   Review of Systems Constitutional: negative for chills, fatigue, fevers and sweats Eyes: negative for irritation, redness and visual disturbance Ears, nose, mouth, throat, and face: negative for hearing loss, nasal congestion, snoring and tinnitus Respiratory: negative for asthma, cough, sputum Cardiovascular: negative for chest pain, dyspnea, exertional chest pressure/discomfort, irregular heart beat, palpitations and syncope Gastrointestinal: negative for abdominal pain, change in bowel habits, nausea and vomiting Genitourinary: negative for abnormal menstrual periods, genital lesions, sexual problems and vaginal discharge, dysuria and urinary incontinence Integument/breast: negative for breast lump, breast tenderness and nipple discharge Hematologic/lymphatic: negative for bleeding and easy bruising Musculoskeletal:negative for back pain and muscle weakness Neurological: negative for dizziness, headaches, vertigo and weakness Endocrine: negative for diabetic symptoms including polydipsia, polyuria and skin dryness Allergic/Immunologic: negative for hay fever and urticaria      Objective:   Blood pressure 102/66, pulse 74, resp. rate 16, height 5\' 9"  (1.753 m), weight 281 lb 3.2 oz (127.6 kg), last menstrual period 06/12/2021. Body mass index is 41.53 kg/m.    General Appearance:    Alert, cooperative, no distress, appears stated age, morbid obesity  Head:    Normocephalic, without obvious abnormality, atraumatic  Eyes:    PERRL, conjunctiva/corneas clear, EOM's intact, both eyes  Ears:    Normal external ear canals, both ears  Nose:   Nares normal, septum midline, mucosa normal, no drainage or sinus tenderness  Throat:   Lips, mucosa, and tongue normal; teeth and gums normal  Neck:   Supple, symmetrical, trachea midline, no adenopathy; thyroid: no enlargement/tenderness/nodules; no carotid bruit or JVD  Back:     Symmetric, no curvature, ROM  normal, no CVA tenderness  Lungs:     Clear to auscultation bilaterally, respirations unlabored  Chest Wall:    No tenderness or deformity   Heart:    Regular rate and rhythm, S1 and S2 normal, no murmur, rub or gallop  Breast Exam:    No tenderness, masses, or nipple abnormality  Abdomen:     Soft, non-tender, bowel sounds active all four quadrants, no masses, no organomegaly.    Genitalia:    Pelvic:external genitalia normal, vagina without lesions, discharge, or tenderness, rectovaginal septum  normal. Cervix normal in appearance, no cervical motion tenderness, no adnexal masses or tenderness.  Uterus normal size, shape, mobile, regular contours, nontender.  Rectal:    Normal external sphincter.  No hemorrhoids appreciated. Internal exam not done.   Extremities:   Extremities normal, atraumatic, no cyanosis or edema  Pulses:   2+ and symmetric all extremities  Skin:   Skin color, texture, turgor normal, no rashes or lesions  Lymph nodes:  Cervical, supraclavicular, and axillary nodes normal  Neurologic:   CNII-XII intact, normal strength, sensation and reflexes throughout   .  Labs:  Lab Results  Component Value Date   WBC 6.3 07/16/2018   HGB 11.5 07/16/2018   HCT 34.2 07/16/2018   MCV 83 07/16/2018   PLT 368 07/16/2018    Lab Results  Component Value Date   CREATININE 0.82 07/16/2018   BUN 10 07/16/2018   NA 138 07/16/2018   K 4.2 07/16/2018   CL 101 07/16/2018   CO2 22 07/16/2018    Lab Results  Component Value Date   ALT 15 06/21/2018   AST 11 06/21/2018   ALKPHOS 61 06/21/2018   BILITOT <0.2 06/21/2018    Lab Results  Component Value Date   TSH 2.380 07/16/2018     Assessment:   1. Encounter for well woman exam with routine gynecological exam   2. Cervical cancer screening   3. Hydradenitis   4. Baker cyst, right   5. Morbid obesity with BMI of 40.0-44.9, adult ([place])   6. History of gastric bypass      Plan:  Blood tests: see orders. Breast self  exam technique reviewed and patient encouraged to perform self-exam monthly. Contraception: OCP (estrogen/progesterone). Discussed healthy lifestyle modifications. Mammogram  Not age appropriate Pap smear ordered. COVID vaccination status: Has completed Moderna vaccine series.  Is eligible for booster Referral placed for dermatologist History of gastric bypass, vitamin labs ordered. Follow up in 1 year for annual exam   Rubie Maid, MD [facility]

## 2021-08-19 LAB — MAGNESIUM: Magnesium: 2 mg/dL (ref 1.6–2.3)

## 2021-08-19 LAB — LIPID PANEL
Chol/HDL Ratio: 4.3 ratio (ref 0.0–4.4)
Cholesterol, Total: 200 mg/dL — ABNORMAL HIGH (ref 100–199)
HDL: 47 mg/dL (ref >39)
LDL Chol Calc (NIH): 141 mg/dL — ABNORMAL HIGH (ref 0–99)
Triglycerides: 65 mg/dL (ref 0–149)
VLDL Cholesterol Cal: 12 mg/dL (ref 5–40)

## 2021-08-19 LAB — IRON,TIBC AND FERRITIN PANEL
Ferritin: 153 ng/mL — ABNORMAL HIGH (ref 15–150)
Iron Saturation: 23 % (ref 15–55)
Iron: 79 ug/dL (ref 27–159)
Total Iron Binding Capacity: 345 ug/dL (ref 250–450)
UIBC: 266 ug/dL (ref 131–425)

## 2021-08-19 LAB — CBC
Hematocrit: 35.7 % (ref 34.0–46.6)
Hemoglobin: 12.4 g/dL (ref 11.1–15.9)
MCH: 30.9 pg (ref 26.6–33.0)
MCHC: 34.7 g/dL (ref 31.5–35.7)
MCV: 89 fL (ref 79–97)
Platelets: 334 10*3/uL (ref 150–450)
RBC: 4.01 x10E6/uL (ref 3.77–5.28)
RDW: 12.2 % (ref 11.7–15.4)
WBC: 4.5 10*3/uL (ref 3.4–10.8)

## 2021-08-19 LAB — COMPREHENSIVE METABOLIC PANEL
ALT: 41 IU/L — ABNORMAL HIGH (ref 0–32)
AST: 23 IU/L (ref 0–40)
Albumin/Globulin Ratio: 1.2 (ref 1.2–2.2)
Albumin: 4.1 g/dL (ref 3.9–5.0)
Alkaline Phosphatase: 71 IU/L (ref 44–121)
BUN/Creatinine Ratio: 11 (ref 9–23)
BUN: 9 mg/dL (ref 6–20)
Bilirubin Total: 0.4 mg/dL (ref 0.0–1.2)
CO2: 23 mmol/L (ref 20–29)
Calcium: 9.4 mg/dL (ref 8.7–10.2)
Chloride: 101 mmol/L (ref 96–106)
Creatinine, Ser: 0.79 mg/dL (ref 0.57–1.00)
Globulin, Total: 3.4 g/dL (ref 1.5–4.5)
Glucose: 68 mg/dL — ABNORMAL LOW (ref 70–99)
Potassium: 4.7 mmol/L (ref 3.5–5.2)
Sodium: 139 mmol/L (ref 134–144)
Total Protein: 7.5 g/dL (ref 6.0–8.5)
eGFR: 104 mL/min/{1.73_m2} (ref >59)

## 2021-08-19 LAB — FOLATE: Folate: 17.7 ng/mL (ref >3.0)

## 2021-08-19 LAB — VITAMIN B12: Vitamin B-12: 648 pg/mL (ref 232–1245)

## 2021-08-19 LAB — HEMOGLOBIN A1C
Est. average glucose Bld gHb Est-mCnc: 111 mg/dL
Hgb A1c MFr Bld: 5.5 % (ref 4.8–5.6)

## 2021-08-19 LAB — VITAMIN D 25 HYDROXY (VIT D DEFICIENCY, FRACTURES): Vit D, 25-Hydroxy: 54.3 ng/mL (ref 30.0–100.0)

## 2021-08-19 LAB — TSH: TSH: 1.39 u[IU]/mL (ref 0.450–4.500)

## 2021-08-20 LAB — CYTOLOGY - PAP: Diagnosis: NEGATIVE

## 2021-09-16 ENCOUNTER — Ambulatory Visit (INDEPENDENT_AMBULATORY_CARE_PROVIDER_SITE_OTHER): Payer: BC Managed Care – PPO

## 2021-09-16 ENCOUNTER — Ambulatory Visit: Payer: BC Managed Care – PPO | Admitting: Podiatry

## 2021-09-16 ENCOUNTER — Other Ambulatory Visit: Payer: Self-pay

## 2021-09-16 DIAGNOSIS — M79671 Pain in right foot: Secondary | ICD-10-CM | POA: Diagnosis not present

## 2021-09-16 DIAGNOSIS — S93521A Sprain of metatarsophalangeal joint of right great toe, initial encounter: Secondary | ICD-10-CM

## 2021-09-16 MED ORDER — MELOXICAM 15 MG PO TABS: 15.0000 mg | ORAL_TABLET | Freq: Every day | ORAL | 0 refills | Status: AC

## 2021-09-21 NOTE — Progress Notes (Signed)
Subjective:  Patient ID: Victoria Gibbs, female    DOB: [birth year],  MRN: [id number]  Chief Complaint  Patient presents with   Toe Pain    Right foot big toe pain Pt stated that she fell down the steps on christmas night and her right foot hit every step when she fell she has had swelling and is unable to bend her toe fully pain is felt when she does try to bend the toe     31 y.o. female presents with the above complaint.  Patient presents with complaint of first MPJ pain.  Patient states she fell down steps on Christmas night and her foot hit every step.  She states she has swelling and pain and unable to bend her foot fully.  She states his pain with ambulation progress or gets worse.  She would like to discuss treatment options she has not seen anyone else prior to seeing me.  She would like to get it evaluated.  Pain scale is 7 out of 10 hurts at the end range of motion of the big toe joint.   Review of Systems: Negative except as noted in the HPI. Denies N/V/F/Ch.  Past Medical History:  Diagnosis Date   Hidradenitis suppurativa    History of trichomoniasis 12/04/2018   Hypertension    Obesity     Current Outpatient Medications:    meloxicam (MOBIC) 15 MG tablet, Take 1 tablet (15 mg total) by mouth daily., Disp: 30 tablet, Rfl: 0   CALCIUM PO, Take by mouth., Disp: , Rfl:    Multiple Vitamin (MULTI-VITAMIN DAILY PO), Take by mouth., Disp: , Rfl:    norethindrone (AYGESTIN) 5 MG tablet, Take 5 mg by mouth 3 (three) times daily., Disp: , Rfl:    norgestimate-ethinyl estradiol (ORTHO-CYCLEN) 0.25-35 MG-MCG tablet, Take 1 tablet by mouth daily., Disp: 84 tablet, Rfl: 3  Social History   Tobacco Use  Smoking Status Never  Smokeless Tobacco Never    No Known Allergies Objective:  There were no vitals filed for this visit. There is no height or weight on file to calculate BMI. Constitutional Well developed. Well nourished.  Vascular Dorsalis pedis pulses palpable  bilaterally. Posterior tibial pulses palpable bilaterally. Capillary refill normal to all digits.  No cyanosis or clubbing noted. Pedal hair growth normal.  Neurologic Normal speech. Oriented to person, place, and time. Epicritic sensation to light touch grossly present bilaterally.  Dermatologic Nails well groomed and normal in appearance. No open wounds. No skin lesions.  Orthopedic:   Pain on palpation of right first metatarsophalangeal joint.  No deep intra-articular pain noted.  No crepitus noted to right first MPJ.  Pain with end range of motion of the first MPJ no pain at the sesamoidal complex.  No extensor or flexor tendinitis noted.   Radiographs: 3 views of skeletally mature adult right foot: No fractures noted.  Sesamoid positions intact.  No bipartite sesamoid noted. Assessment:   1. Sprain of metatarsophalangeal joint of right great toe, initial encounter    Plan:  Patient was evaluated and treated and all questions answered.  Right first MPJ sprain without fracture -All questions and concerns were discussed with the patient in extensive detail. -She may have undergone hyperflexion injuries leading to excessive pressure on the first MPJ joint therefore the pain.  I discussed this with the patient.  I believe she will benefit from cam boot immobilization and allow the soft tissue structures to heal appropriately.  She may come out of the  cam boot in 4 weeks  -Cam boot was dispensed -Mobic was dispensed for pain control  No follow-ups on file.

## 2021-10-05 ENCOUNTER — Other Ambulatory Visit: Payer: Self-pay

## 2021-10-05 ENCOUNTER — Ambulatory Visit: Payer: BLUE CROSS/BLUE SHIELD | Admitting: Dermatology

## 2021-10-05 DIAGNOSIS — L732 Hidradenitis suppurativa: Secondary | ICD-10-CM

## 2021-10-05 DIAGNOSIS — M6740 Ganglion, unspecified site: Secondary | ICD-10-CM

## 2021-10-05 DIAGNOSIS — M67411 Ganglion, right shoulder: Secondary | ICD-10-CM | POA: Diagnosis not present

## 2021-10-05 MED ORDER — DOXYCYCLINE MONOHYDRATE 100 MG PO CAPS: 100.0000 mg | ORAL_CAPSULE | Freq: Two times a day (BID) | ORAL | 4 refills | Status: DC

## 2021-10-05 MED ORDER — CLINDAMYCIN PHOSPHATE 1 % EX LOTN: TOPICAL_LOTION | Freq: Every day | CUTANEOUS | 4 refills | Status: DC

## 2021-10-05 NOTE — Patient Instructions (Addendum)
Start Benzo peroxide wash for sensitive skin otc can use either cerave acne foaming wash or panoxyl 4 % creamy wash, cetaphil wash for sensitive skin.  Use in shower to affected areas under arms and at groin. Leave for a few minutes and rinse.  Doxycycline start if flared taking twice daily with food. When not flared can take 1 pill by mouth daily with food.   Clindamycin lotion - apply to affected areas daily.    Doxycycline should be taken with food to prevent nausea. Do not lay down for 30 minutes after taking. Be cautious with sun exposure and use good sun protection while on this medication. Pregnant women should not take this medication.       If You Need Anything After Your Visit  If you have any questions or concerns for your doctor, please call our main line at [phone] and press option 4 to reach your doctor's medical assistant. If no one answers, please leave a voicemail as directed and we will return your call as soon as possible. Messages left after 4 pm will be answered the following business day.   You may also send [place] a message via MyChart. We typically respond to MyChart messages within 1-2 business days.  For prescription refills, please ask your pharmacy to contact our office. Our fax number is [phone].  If you have an urgent issue when the clinic is closed that cannot wait until the next business day, you can page your doctor at the number below.    Please note that while we do our best to be available for urgent issues outside of office hours, we are not available 24/7.   If you have an urgent issue and are unable to reach [place], you may choose to seek medical care at your doctor's office, retail clinic, urgent care center, or emergency room.  If you have a medical emergency, please immediately call 911 or go to the emergency department.  Pager Numbers  - Dr. Gwen Pounds: [phone]  - Dr. Neale Burly: [phone]  - Dr. Roseanne Reno: [phone]  In the event  of inclement weather, please call our main line at [phone] for an update on the status of any delays or closures.  Dermatology Medication Tips: Please keep the boxes that topical medications come in in order to help keep track of the instructions about where and how to use these. Pharmacies typically print the medication instructions only on the boxes and not directly on the medication tubes.   If your medication is too expensive, please contact our office at [phone] option 4 or send [place] a message through MyChart.   We are unable to tell what your co-pay for medications will be in advance as this is different depending on your insurance coverage. However, we may be able to find a substitute medication at lower cost or fill out paperwork to get insurance to cover a needed medication.   If a prior authorization is required to get your medication covered by your insurance company, please allow [place] 1-2 business days to complete this process.  Drug prices often vary depending on where the prescription is filled and some pharmacies may offer cheaper prices.  The website www.goodrx.com contains coupons for medications through different pharmacies. The prices here do not account for what the cost may be with help from insurance (it may be cheaper with your insurance), but the website can give you the price if you did not use any insurance.  - You can print the associated  coupon and take it with your prescription to the pharmacy.  - You may also stop by our office during regular business hours and pick up a GoodRx coupon card.  - If you need your prescription sent electronically to a different pharmacy, notify our office through [facility] or by phone at [phone] option 4.     Si Usted Necesita Algo Despus de Su Visita  Tambin puede enviarnos un mensaje a travs de Clinical cytogeneticist. Por lo general respondemos a los mensajes de MyChart en el transcurso de 1 a 2 das hbiles.  Para  renovar recetas, por favor pida a su farmacia que se ponga en contacto con nuestra oficina. Annie Sable de fax es [place] [phone].  Si tiene un asunto urgente cuando la clnica est cerrada y que no puede esperar hasta el siguiente da hbil, puede llamar/localizar a su doctor(a) al nmero que aparece a continuacin.   Por favor, tenga en cuenta que aunque hacemos todo lo posible para estar disponibles para asuntos urgentes fuera del horario de [place], no estamos disponibles las 24 horas del da, los 7 [address]  [address] de la [place].   Si tiene un problema urgente y no puede comunicarse con nosotros, puede optar por buscar atencin mdica  en el consultorio de su doctor(a), en una clnica privada, en un centro de atencin urgente o en una sala de emergencias.  Si tiene Engineer, drilling, por favor llame inmediatamente al 911 o vaya a la sala de emergencias.  Nmeros de bper  - Dr. Gwen Pounds: [phone]  - Dra. Moye: [phone]  - Dra. Roseanne Reno: [phone]  En caso de inclemencias del [place], por favor llame a Lacy Duverney principal al [phone] para una actualizacin sobre el [place] de cualquier retraso o cierre.  Consejos para la medicacin en dermatologa: Por favor, guarde las cajas en las que vienen los medicamentos de uso tpico para ayudarle a seguir las instrucciones sobre dnde y cmo usarlos. Las farmacias generalmente imprimen las instrucciones del medicamento slo en las cajas y no directamente en los tubos del [place].   Si su medicamento es muy caro, por favor, pngase en contacto con Rolm Gala llamando al [phone] y presione la opcin 4 o envenos un mensaje a travs de Clinical cytogeneticist.   No podemos decirle cul ser su copago por los medicamentos por adelantado ya que esto es diferente dependiendo de la cobertura de su seguro. Sin embargo, es posible que podamos encontrar un medicamento sustituto a Audiological scientist un formulario para que el seguro cubra el  medicamento que se considera necesario.   Si se requiere una autorizacin previa para que su compaa de seguros [place] su medicamento, por favor permtanos de 1 a 2 das hbiles para completar [address]  Los precios de los medicamentos varan con frecuencia dependiendo del Environmental consultant de dnde se surte la receta y alguna farmacias pueden ofrecer precios ms baratos.  El sitio web www.goodrx.com tiene cupones para medicamentos de Health and safety inspector. Los precios aqu no tienen en cuenta lo que podra costar con la ayuda del seguro (puede ser ms barato con su seguro), pero el sitio web puede darle el precio si no utiliz Tourist information centre manager.  - Puede imprimir el cupn correspondiente y llevarlo con su receta a la farmacia.  - Tambin puede pasar por nuestra oficina durante el horario de atencin regular y Education officer, museum una tarjeta de cupones de GoodRx.  - Si necesita que su receta se enve electrnicamente a Psychiatrist, informe a nuestra oficina a travs  [place] o por telfono llamando al [phone] y presione la opcin 4.

## 2021-10-05 NOTE — Progress Notes (Signed)
° °  New Patient Visit  Subjective  Victoria Gibbs is a 31 y.o. female who presents for the following: New Patient (Initial Visit) (Patient here today as a new patient concerning a cyst at right wrist. Patient has history of hidrinitis at bilateral axilla. Patient reports some areas at private area but hasnt had flare in years. ). Was much worse under arms in the past, now gets a cyst flared once monthly under arms, and sometimes in groin. Cyst on R wrist has gone down since she made appointment.    The following portions of the chart were reviewed this encounter and updated as appropriate:       Review of Systems:  No other skin or systemic complaints except as noted in HPI or Assessment and Plan.  Objective  Well appearing patient in no apparent distress; mood and affect are within normal limits.  A focused examination was performed including axilla, arms, chest. Relevant physical exam findings are noted in the Assessment and Plan.  bilateral axilla, groin Hyperpigmented nodules and extensive firm ropey scarring of axilla bilateral, no active lesions today  right dorsal wrist 1.5 cm slight firm subq nodule     Assessment & Plan  Hidradenitis suppurativa bilateral axilla, groin  Hidradenitis Suppurativa is a chronic; persistent; non-curable, but treatable condition due to abnormal inflamed sweat glands in the body folds (axilla, inframammary, groin, medial thighs), causing recurrent painful draining cysts and scarring. It can be associated with severe scarring acne and cysts; also abscesses and scarring of scalp. The goal is control and prevention of flares, as it is not curable. Scars are permanent and can be thickened. Treatment may include daily use of topical medication and oral antibiotics.  Oral isotretinoin may also be helpful.  For more severe cases, Humira (a biologic injection) may be prescribed to decrease the inflammatory process and prevent flares.  When indicated,  inflamed cysts may also be treated surgically.    Start Benzoyl peroxide wash for sensitive skin- i.e. otc cerave acne foaming wash or panoxyl 4 % creamy wash, cetaphil acne wash for sensitive skin  Benzoyl peroxide can cause dryness and irritation of the skin. It can also bleach fabric. When used together with Aczone (dapsone) cream, it can stain the skin orange.   Start Doxycycline 100 mg PO bid prn flares, may switch to 100 mg PO qd if frequent flares as a preventative Start clindamycin lotion qd after shower  Doxycycline should be taken with food to prevent nausea. Do not lay down for 30 minutes after taking. Be cautious with sun exposure and use good sun protection while on this medication. Pregnant women should not take this medication.   Discussed  Humira injections if not controlled with above regimen     clindamycin (CLEOCIN-T) 1 % lotion - bilateral axilla, groin Apply topically daily. To affected areas under arms  doxycycline (MONODOX) 100 MG capsule - bilateral axilla, groin Take 1 capsule (100 mg total) by mouth 2 (two) times daily. Use when flared twice daily. Use once daily if not flared.  Take with food  Ganglion cyst right dorsal wrist  Favor ganglion cyst- improving  Recommend orthopedic hand surgeon if enlarges and is symptomatic     Return for 3 month hs follow up.  I, Asher Muir, CMA, am acting as scribe for Willeen Niece, MD.  Documentation: I have reviewed the above documentation for accuracy and completeness, and I agree with the above.  Willeen Niece MD

## 2021-10-14 ENCOUNTER — Telehealth: Payer: Self-pay

## 2021-10-14 DIAGNOSIS — B379 Candidiasis, unspecified: Secondary | ICD-10-CM

## 2021-10-14 MED ORDER — FLUCONAZOLE 150 MG PO TABS: 150.0000 mg | ORAL_TABLET | Freq: Once | ORAL | 0 refills | Status: AC

## 2021-10-14 NOTE — Telephone Encounter (Signed)
Prescription sent in.   Dr. Valentino Saxon

## 2021-10-19 ENCOUNTER — Ambulatory Visit: Payer: BC Managed Care – PPO | Admitting: Podiatry

## 2021-11-25 ENCOUNTER — Telehealth: Payer: Self-pay | Admitting: Obstetrics and Gynecology

## 2021-11-25 NOTE — Telephone Encounter (Signed)
Pt called stating she need a refill on her BC pills. Confirmed pharmacy with patient as [facility] on [address]. Please advise. ?

## 2021-11-29 MED ORDER — NORGESTIMATE-ETH ESTRADIOL 0.25-35 MG-MCG PO TABS: 1.0000 | ORAL_TABLET | Freq: Every day | ORAL | 3 refills | Status: DC

## 2021-11-29 NOTE — Telephone Encounter (Signed)
Looked back through her history. She has been on Microgestin or Junel. Would you like me to send that in for her.  ?

## 2021-11-29 NOTE — Telephone Encounter (Signed)
Prescription for Ortho-Cyclen sent in to patient's phamacy.  ?

## 2022-01-10 ENCOUNTER — Ambulatory Visit: Payer: BLUE CROSS/BLUE SHIELD | Admitting: Dermatology

## 2022-01-10 DIAGNOSIS — L732 Hidradenitis suppurativa: Secondary | ICD-10-CM

## 2022-01-10 DIAGNOSIS — L02411 Cutaneous abscess of right axilla: Secondary | ICD-10-CM | POA: Diagnosis not present

## 2022-01-10 NOTE — Progress Notes (Signed)
? ?Follow-Up Visit ?  ?Subjective  ?Victoria Gibbs is a 31 y.o. female who presents for the following: Follow-up (Patient here today for 3 month HS follow up. Patient unable to tolerate BP wash due to dryness and irritation. She is using clindamycin lotion daily. Patient had flare last week at right axilla and took doxycycline 100 mg twice daily for 3 days, advises area has drained. ). ? ? ?The following portions of the chart were reviewed this encounter and updated as appropriate:  ?  ?  ? ?Review of Systems:  No other skin or systemic complaints except as noted in HPI or Assessment and Plan. ? ?Objective  ?Well appearing patient in no apparent distress; mood and affect are within normal limits. ? ?A focused examination was performed including axilla. Relevant physical exam findings are noted in the Assessment and Plan. ? ?Right Axilla ?Firm subq nodule with yellow drainage with associated firm ropey nodules c/w cysts and scarring ? ? ? ?Assessment & Plan  ?Hidradenitis suppurativa ?Right Axilla ? ?Chronic and persistent condition with duration or expected duration over one year. Condition is bothersome/symptomatic for patient. Currently flared. With Inflamed Cyst/Abcess- I&D today ? ?Recommend Cln wash or hypochlorous spray daily in place of BPO wash  ?Continue clindamycin lotion daily. ? ?Continue doxycycline 100 mg twice daily with food for up to 2 weeks as needed for flares.  ? ?Doxycycline should be taken with food to prevent nausea. Do not lay down for 30 minutes after taking. Be cautious with sun exposure and use good sun protection while on this medication. Pregnant women should not take this medication.  ? ?Hidradenitis Suppurativa is a chronic; persistent; non-curable, but treatable condition due to abnormal inflamed sweat glands in the body folds (axilla, inframammary, groin, medial thighs), causing recurrent painful draining cysts and scarring. It can be associated with severe scarring acne and  cysts; also abscesses and scarring of scalp. The goal is control and prevention of flares, as it is not curable. Scars are permanent and can be thickened. Treatment may include daily use of topical medication and oral antibiotics.  Oral isotretinoin may also be helpful.  For more severe cases, Humira (a biologic injection) may be prescribed to decrease the inflammatory process and prevent flares.  When indicated, inflamed cysts may also be treated surgically.  ? ? ?Incision and Drainage - Right Axilla ?Location: right axilla ? ?Informed Consent: Discussed risks (permanent scarring, light or dark discoloration, infection, pain, bleeding, bruising, redness, damage to adjacent structures, and recurrence of the lesion) and benefits of the procedure, as well as the alternatives.  Informed consent was obtained. ? ?Preparation: The area was prepped with alcohol. ? ?Anesthesia: Lidocaine 2% with epinephrine ? ?Procedure Details: An incision was made overlying the lesion. The lesion drained pus and blood.  ?A small amount of fluid was drained.    ?Antibiotic ointment and a sterile pressure dressing were applied. The patient tolerated procedure well. ? ?Total number of lesions drained: 1 ? ?Plan: The patient was instructed on post-op care. Recommend OTC analgesia as needed for pain. ? ? ?Related Medications ?clindamycin (CLEOCIN-T) 1 % lotion ?Apply topically daily. To affected areas under arms ? ?doxycycline (MONODOX) 100 MG capsule ?Take 1 capsule (100 mg total) by mouth 2 (two) times daily. Use when flared twice daily. Use once daily if not flared.  Take with food ? ? ?Return in about 3 months (around 04/11/2022) for HS. ? ?Graciella Belton, RMA, am acting as scribe for Brendolyn Patty, MD . ? ?  Documentation: I have reviewed the above documentation for accuracy and completeness, and I agree with the above. ? ?Brendolyn Patty MD  ? ?

## 2022-01-10 NOTE — Patient Instructions (Addendum)
Recommend Cln wash daily in place of BP wash. ? ?Continue doxycycline 100 mg twice daily with food for up to 2 weeks as needed for flares.  ? ?Doxycycline should be taken with food to prevent nausea. Do not lay down for 30 minutes after taking. Be cautious with sun exposure and use good sun protection while on this medication. Pregnant women should not take this medication.  ? ?Wound Care Instructions ? ?Cleanse wound gently with soap and water once a day then pat dry with clean gauze. Apply a thing coat of Petrolatum (petroleum jelly, "Vaseline") over the wound (unless you have an allergy to this). We recommend that you use a new, sterile tube of Vaseline. Do not pick or remove scabs. Do not remove the yellow or white "healing tissue" from the base of the wound. ? ?Cover the wound with fresh, clean, nonstick gauze and secure with paper tape. You may use Band-Aids in place of gauze and tape if the would is small enough, but would recommend trimming much of the tape off as there is often too much. Sometimes Band-Aids can irritate the skin. ? ?You should call the office for your biopsy report after 1 week if you have not already been contacted. ? ?If you experience any problems, such as abnormal amounts of bleeding, swelling, significant bruising, significant pain, or evidence of infection, please call the office immediately. ? ?FOR ADULT SURGERY PATIENTS: If you need something for pain relief you may take 1 extra strength Tylenol (acetaminophen) AND 2 Ibuprofen (200mg  each) together every 4 hours as needed for pain. (do not take these if you are allergic to them or if you have a reason you should not take them.) Typically, you may only need pain medication for 1 to 3 days.  ? ? ?If You Need Anything After Your Visit ? ?If you have any questions or concerns for your doctor, please call our main line at [phone] and press option 4 to reach your doctor's medical assistant. If no one answers, please leave a voicemail  as directed and we will return your call as soon as possible. Messages left after 4 pm will be answered the following business day.  ? ?You may also send us a message via MyChart. We typically respond to MyChart messages within 1-2 business days. ? ?For prescription refills, please ask your pharmacy to contact our office. Our fax number is [phone]. ? ?If you have an urgent issue when the clinic is closed that cannot wait until the next business day, you can page your doctor at the number below.   ? ?Please note that while we do our best to be available for urgent issues outside of office hours, we are not available 24/7.  ? ?If you have an urgent issue and are unable to reach us, you may choose to seek medical care at your doctor's office, retail clinic, urgent care center, or emergency room. ? ?If you have a medical emergency, please immediately call 911 or go to the emergency department. ? ?Pager Numbers ? ?- Dr. Gwen PoundsKowalski: [phone] ? ?- Dr. Neale BurlyMoye: [phone] ? ?- Dr. Roseanne RenoStewart: [phone] ? ?In the event of inclement weather, please call our main line at [phone] for an update on the status of any delays or closures. ? ?Dermatology Medication Tips: ?Please keep the boxes that topical medications come in in order to help keep track of the instructions about where and how to use these. Pharmacies typically print the medication instructions only on the boxes  and not directly on the medication tubes.  ? ?If your medication is too expensive, please contact our office at [phone] option 4 or send [place] a message through MyChart.  ? ?We are unable to tell what your co-pay for medications will be in advance as this is different depending on your insurance coverage. However, we may be able to find a substitute medication at lower cost or fill out paperwork to get insurance to cover a needed medication.  ? ?If a prior authorization is required to get your medication covered by your insurance company, please  allow [place] 1-2 business days to complete this process. ? ?Drug prices often vary depending on where the prescription is filled and some pharmacies may offer cheaper prices. ? ?The website www.goodrx.com contains coupons for medications through different pharmacies. The prices here do not account for what the cost may be with help from insurance (it may be cheaper with your insurance), but the website can give you the price if you did not use any insurance.  ?- You can print the associated coupon and take it with your prescription to the pharmacy.  ?- You may also stop by our office during regular business hours and pick up a GoodRx coupon card.  ?- If you need your prescription sent electronically to a different pharmacy, notify our office through [facility] or by phone at [phone] option 4. ? ? ? ? ?Si Usted Necesita Algo Despu?s de Su Visita ? ?Tambi?n puede enviarnos un mensaje a trav?s de MyChart. Por lo general respondemos a los mensajes de MyChart en el transcurso de 1 a 2 d?as h?biles. ? ?Para renovar recetas, por favor pida a su farmacia que se ponga en contacto con nuestra oficina. Nuestro n?mero de fax es el [phone]. ? ?Si tiene un asunto urgente cuando la cl?nica est? cerrada y que no puede esperar hasta el siguiente d?a h?bil, puede llamar/localizar a su doctor(a) al n?mero que aparece a continuaci?n.  ? ?Por favor, tenga en cuenta que aunque hacemos todo lo posible para estar disponibles para asuntos urgentes fuera del horario de oficina, no estamos disponibles las 24 horas del d?a, los 7 d?as de la semana.  ? ?Si tiene un problema urgente y no puede comunicarse con nosotros, puede optar por buscar atenci?n m?dica  en el consultorio de su doctor(a), en una cl?nica privada, en un centro de atenci?n urgente o en una sala de emergencias. ? ?Si tiene Radio broadcast assistant m?dica, por favor llame inmediatamente al 911 o vaya a la sala de emergencias. ? ?N?meros de b?per ? ?- Dr. Gwen Pounds:  [phone] ? ?- Dra. Moye: [phone] ? ?- Dra. Roseanne Reno: [phone] ? ?En caso de inclemencias del tiempo, por favor llame a nuestra l?nea principal al [phone] para una actualizaci?n sobre el estado de cualquier retraso o cierre. ? ?Consejos para la medicaci?n en dermatolog?a: ?Por favor, guarde las cajas en las que vienen los medicamentos de uso t?pico para ayudarle a seguir las instrucciones sobre d?nde y c?mo usarlos. Las farmacias generalmente imprimen las instrucciones del medicamento s?lo en las cajas y no directamente en los tubos del [place].  ? ?Si su medicamento es muy caro, por favor, p?ngase en contacto con Rolm Gala llamando al [phone] y presione la opci?n 4 o env?enos un mensaje a trav?s de MyChart.  ? ?No podemos decirle cu?l ser? su copago por los medicamentos por adelantado ya que esto es diferente dependiendo de la cobertura de su seguro. Sin embargo, es posible que podamos Clinical research associate  un medicamento sustituto a Audiological scientist un formulario para que el seguro cubra el medicamento que se considera necesario.  ? ?Si se requiere Neomia Dear autorizaci?n previa para que su compa??a de seguros [place] su medicamento, por favor perm?tanos de 1 a 2 d?as h?biles para completar este proceso. ? ?Los precios de los medicamentos var?an con frecuencia dependiendo del Environmental consultant de d?nde se surte la receta y alguna farmacias pueden ofrecer precios m?s baratos. ? ?El sitio web www.goodrx.com tiene cupones para medicamentos de Health and safety inspector. Los precios aqu? no tienen en cuenta lo que podr?a costar con la ayuda del seguro (puede ser m?s barato con su seguro), pero el sitio web puede darle el precio si no utiliz? ning?n seguro.  ?- Puede imprimir el cup?n correspondiente y llevarlo con su receta a la farmacia.  ?- Tambi?n puede pasar por nuestra oficina durante el horario de atenci?n regular y recoger una tarjeta de cupones de GoodRx.  ?- Si necesita que su receta se env?e electr?nicamente a  Psychiatrist, informe a nuestra oficina a trav?s de MyChart de [place] o por tel?fono llamando al [phone] y presione la opci?n 4. ? ?

## 2022-04-12 ENCOUNTER — Ambulatory Visit: Payer: BLUE CROSS/BLUE SHIELD | Admitting: Dermatology

## 2022-05-11 ENCOUNTER — Encounter: Payer: Self-pay | Admitting: Dermatology

## 2022-05-11 ENCOUNTER — Telehealth: Payer: Self-pay

## 2022-05-11 ENCOUNTER — Ambulatory Visit: Payer: BLUE CROSS/BLUE SHIELD | Admitting: Dermatology

## 2022-05-11 DIAGNOSIS — M6740 Ganglion, unspecified site: Secondary | ICD-10-CM

## 2022-05-11 DIAGNOSIS — M67431 Ganglion, right wrist: Secondary | ICD-10-CM | POA: Diagnosis not present

## 2022-05-11 DIAGNOSIS — L732 Hidradenitis suppurativa: Secondary | ICD-10-CM | POA: Diagnosis not present

## 2022-05-11 MED ORDER — CLINDAMYCIN PHOSPHATE 1 % EX LOTN: TOPICAL_LOTION | Freq: Every day | CUTANEOUS | 4 refills | Status: AC

## 2022-05-11 MED ORDER — MINOCYCLINE HCL 100 MG PO CAPS: ORAL_CAPSULE | ORAL | 3 refills | Status: AC

## 2022-05-11 NOTE — Telephone Encounter (Signed)
Faxed referral to [facility], [place], [place]. JP

## 2022-05-11 NOTE — Progress Notes (Signed)
   Follow-Up Visit   Subjective  Victoria Gibbs is a 31 y.o. female who presents for the following: Follow-up (3 month recheck. HS. Taking Doxycycline as directed as needed for flares, using Clindamycin lotion as directed. Flared recently at right axilla. States areas will not close up. Doxycycline is causing nausea and she is taking with food. Breaking out every 2-3 months).    The following portions of the chart were reviewed this encounter and updated as appropriate:      Review of Systems: No other skin or systemic complaints except as noted in HPI or Assessment and Plan.   Objective  Well appearing patient in no apparent distress; mood and affect are within normal limits.  A focused examination was performed including B/L axillae. Relevant physical exam findings are noted in the Assessment and Plan.  Right Wrist - Posterior 1.5  cm firm subcutaneous nodule   B/L axillae Multiple firm ropey nodules with scarring at BL axilla.   Assessment & Plan  Ganglion cyst Right Wrist - Posterior  Lesion is symptomatic, painful.   Will send referral to [facility] in [place].  Ambulatory referral to Orthopedic Surgery - Right Wrist - Posterior  Hidradenitis suppurativa B/L axillae  Chronic and persistent condition with duration or expected duration over one year. Condition is symptomatic/ bothersome to patient. Not currently at goal.   Stop Doxycycline due to GI upset  Start Minocycline 100 mg 1 capsule twice daily up to 2 weeks as needed for flares. Take with food. Take once daily for preventative if flares increasing in frequency.  Continue Clindamycin lotion daily to aas as directed.   Recommend using Cln Body Wash daily, leave on for 1-2 minutes before rinsing off. This can be purchased online.    Discussed starting Humira or Cosentyx if condition worsens  Recommend surgery if would like recurrent scarred cyst areas removed. Would likely require large areas of  skin grafting in axilla due to severity of scarring  Hidradenitis Suppurativa is a chronic; persistent; non-curable, but treatable condition due to abnormal inflamed sweat glands in the body folds (axilla, inframammary, groin, medial thighs), causing recurrent painful draining cysts and scarring. It can be associated with severe scarring acne and cysts; also abscesses and scarring of scalp. The goal is control and prevention of flares, as it is not curable. Scars are permanent and can be thickened. Treatment may include daily use of topical medication and oral antibiotics.  Oral isotretinoin may also be helpful.  For more severe cases, Humira (a biologic injection) may be prescribed to decrease the inflammatory process and prevent flares.  When indicated, inflamed cysts may also be treated surgically.     minocycline (MINOCIN) 100 MG capsule - B/L axillae Take 1 capsule twice daily with food  Related Medications clindamycin (CLEOCIN-T) 1 % lotion Apply topically daily. To affected areas under arms   Return in about 6 months (around 11/11/2022) for HS Recheck.  I, Lawson Radar, CMA, am acting as scribe for Willeen Niece, MD.  Documentation: I have reviewed the above documentation for accuracy and completeness, and I agree with the above.  Willeen Niece MD

## 2022-05-11 NOTE — Patient Instructions (Addendum)
Stop Doxycycline  Start Minocycline 100 mg 1 capsule twice daily up to 2 weeks as needed for flares. Take with food.   Continue Clindamycin lotion as directed.    Recommend using Cln Body Wash daily, leave on for 1-2 minutes before rinsing off. This can be purchased online.    Due to recent changes in healthcare laws, you may see results of your pathology and/or laboratory studies on MyChart before the doctors have had a chance to review them. We understand that in some cases there may be results that are confusing or concerning to you. Please understand that not all results are received at the same time and often the doctors may need to interpret multiple results in order to provide you with the best plan of care or course of treatment. Therefore, we ask that you please give [place] 2 business days to thoroughly review all your results before contacting the office for clarification. Should we see a critical lab result, you will be contacted sooner.   If You Need Anything After Your Visit  If you have any questions or concerns for your doctor, please call our main line at [phone] and press option 4 to reach your doctor's medical assistant. If no one answers, please leave a voicemail as directed and we will return your call as soon as possible. Messages left after 4 pm will be answered the following business day.   You may also send [place] a message via MyChart. We typically respond to MyChart messages within 1-2 business days.  For prescription refills, please ask your pharmacy to contact our office. Our fax number is [phone].  If you have an urgent issue when the clinic is closed that cannot wait until the next business day, you can page your doctor at the number below.    Please note that while we do our best to be available for urgent issues outside of office hours, we are not available 24/7.   If you have an urgent issue and are unable to reach [place], you may choose to seek medical care at  your doctor's office, retail clinic, urgent care center, or emergency room.  If you have a medical emergency, please immediately call 911 or go to the emergency department.  Pager Numbers  - Dr. Gwen Pounds: [phone]  - Dr. Neale Burly: [phone]  - Dr. Roseanne Reno: [phone]  In the event of inclement weather, please call our main line at [phone] for an update on the status of any delays or closures.  Dermatology Medication Tips: Please keep the boxes that topical medications come in in order to help keep track of the instructions about where and how to use these. Pharmacies typically print the medication instructions only on the boxes and not directly on the medication tubes.   If your medication is too expensive, please contact our office at [phone] option 4 or send [place] a message through MyChart.   We are unable to tell what your co-pay for medications will be in advance as this is different depending on your insurance coverage. However, we may be able to find a substitute medication at lower cost or fill out paperwork to get insurance to cover a needed medication.   If a prior authorization is required to get your medication covered by your insurance company, please allow [place] 1-2 business days to complete this process.  Drug prices often vary depending on where the prescription is filled and some pharmacies may offer cheaper prices.  The website www.goodrx.com contains coupons for  medications through different pharmacies. The prices here do not account for what the cost may be with help from insurance (it may be cheaper with your insurance), but the website can give you the price if you did not use any insurance.  - You can print the associated coupon and take it with your prescription to the pharmacy.  - You may also stop by our office during regular business hours and pick up a GoodRx coupon card.  - If you need your prescription sent electronically to a different pharmacy,  notify our office through [facility] or by phone at [phone] option 4.     Si Usted Necesita Algo Despus de Su Visita  Tambin puede enviarnos un mensaje a travs de Pharmacist, community. Por lo general respondemos a los mensajes de MyChart en el transcurso de 1 a 2 das hbiles.  Para renovar recetas, por favor pida a su farmacia que se ponga en contacto con nuestra oficina. Harland Dingwall de fax es [place] [phone].  Si tiene un asunto urgente cuando la clnica est cerrada y que no puede esperar hasta el siguiente da hbil, puede llamar/localizar a su doctor(a) al nmero que aparece a continuacin.   Por favor, tenga en cuenta que aunque hacemos todo lo posible para estar disponibles para asuntos urgentes fuera del horario de [place], no estamos disponibles las 24 horas del da, los 7 das de la [place].   Si tiene un problema urgente y no puede comunicarse con nosotros, puede optar por buscar atencin mdica  en el consultorio de su doctor(a), en una clnica privada, en un centro de atencin urgente o en una sala de emergencias.  Si tiene Engineering geologist, por favor llame inmediatamente al 911 o vaya a la sala de emergencias.  Nmeros de bper  - Dr. Nehemiah Massed: [phone]  - Dra. Moye: [phone]  - Dra. Nicole Kindred: [phone]  En caso de inclemencias del [place], por favor llame a Johnsie Kindred principal al [phone] para una actualizacin sobre el [place] de cualquier retraso o cierre.  Consejos para la medicacin en dermatologa: Por favor, guarde las cajas en las que vienen los medicamentos de uso tpico para ayudarle a seguir las instrucciones sobre dnde y cmo usarlos. Las farmacias generalmente imprimen las instrucciones del medicamento slo en las cajas y no directamente en los tubos del [place].   Si su medicamento es muy caro, por favor, pngase en contacto con Zigmund Daniel llamando al [phone] y presione la opcin 4 o envenos un mensaje a travs de  Pharmacist, community.   No podemos decirle cul ser su copago por los medicamentos por adelantado ya que esto es diferente dependiendo de la cobertura de su seguro. Sin embargo, es posible que podamos encontrar un medicamento sustituto a Electrical engineer un formulario para que el seguro cubra el medicamento que se considera necesario.   Si se requiere una autorizacin previa para que su compaa de seguros [place] su medicamento, por favor permtanos de 1 a 2 das hbiles para completar este proceso.  Los precios de los medicamentos varan con frecuencia dependiendo del Environmental consultant de dnde se surte la receta y alguna farmacias pueden ofrecer precios ms baratos.  El sitio web www.goodrx.com tiene cupones para medicamentos de Airline pilot. Los precios aqu no tienen en cuenta lo que podra costar con la ayuda del seguro (puede ser ms barato con su seguro), pero el sitio web puede darle el precio si no utiliz Research scientist (physical sciences).  - Puede imprimir el cupn correspondiente y  llevarlo con su receta a la farmacia.  - Tambin puede pasar por nuestra oficina durante el horario de atencin regular y Charity fundraiser una tarjeta de cupones de GoodRx.  - Si necesita que su receta se enve electrnicamente a una farmacia diferente, informe a nuestra oficina a travs de MyChart de [place] o por telfono llamando al [phone] y presione la opcin 4.

## 2022-06-24 ENCOUNTER — Ambulatory Visit (INDEPENDENT_AMBULATORY_CARE_PROVIDER_SITE_OTHER): Payer: BLUE CROSS/BLUE SHIELD | Admitting: Obstetrics and Gynecology

## 2022-06-24 ENCOUNTER — Other Ambulatory Visit (HOSPITAL_COMMUNITY)
Admission: RE | Admit: 2022-06-24 | Discharge: 2022-06-24 | Disposition: A | Discharge status: Home / Self Care | Payer: BLUE CROSS/BLUE SHIELD | Source: Ambulatory Visit | Attending: Obstetrics and Gynecology | Admitting: Obstetrics and Gynecology

## 2022-06-24 VITALS — BP 143/86 | HR 80 | Ht 69.0 in | Wt 264.9 lb

## 2022-06-24 DIAGNOSIS — B3731 Acute candidiasis of vulva and vagina: Secondary | ICD-10-CM | POA: Diagnosis not present

## 2022-06-24 DIAGNOSIS — N898 Other specified noninflammatory disorders of vagina: Secondary | ICD-10-CM | POA: Diagnosis not present

## 2022-06-24 NOTE — Progress Notes (Signed)
Patient presents today due to yeast. She states symptoms include vaginal itching along with white odorous discharge starting on 06/22/22. Patient states she has had a new sexual partner, STD screening was added. Self swab culture preformed. All questions asked, patient aware office will be in touch with results.

## 2022-06-28 LAB — CERVICOVAGINAL ANCILLARY ONLY
Bacterial Vaginitis (gardnerella): NEGATIVE
Candida Glabrata: NEGATIVE
Candida Vaginitis: POSITIVE — AB
Chlamydia: NEGATIVE
Comment: NEGATIVE
Comment: NEGATIVE
Comment: NEGATIVE
Comment: NEGATIVE
Comment: NEGATIVE
Comment: NORMAL
Neisseria Gonorrhea: NEGATIVE
Trichomonas: NEGATIVE

## 2022-06-30 MED ORDER — FLUCONAZOLE 150 MG PO TABS: 150.0000 mg | ORAL_TABLET | Freq: Once | ORAL | 3 refills | Status: AC

## 2022-06-30 NOTE — Addendum Note (Signed)
Addended by: Augusto Gamble on: 06/30/2022 05:38 PM   Modules accepted: Orders

## 2022-08-19 ENCOUNTER — Ambulatory Visit: Payer: BLUE CROSS/BLUE SHIELD | Admitting: Obstetrics and Gynecology

## 2022-08-22 NOTE — Progress Notes (Unsigned)
GYNECOLOGY ANNUAL PHYSICAL EXAM PROGRESS NOTE  Subjective:    Victoria Gibbs is a 31 y.o. G59P1001 female who presents for an annual exam. The patient is sexually active. The patient participates in regular exercise: yes. Has the patient ever been transfused or tattooed?: no. The patient reports that there is not domestic violence in her life. 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   The patient has the following complaints today: Patient thinks that she may have a yeast infection.  Was just treated for a yeast infection last month, also was screened for STI's last month, was negative.   Menstrual History: Menarche age: 66 No LMP recorded. (Menstrual status: Oral contraceptives).     Gynecologic History:  Contraception: OCP (estrogen/progesterone) History of STI's:  Trichomonas, treated in 11/2018.   Last Pap: 08/18/2021. Results were: normal.  Denies h/o abnormal pap smears.    Upstream - 08/23/22 0826       Pregnancy Intention Screening   Does the patient want to become pregnant in the next year? No    Does the patient's partner want to become pregnant in the next year? No    Would the patient like to discuss contraceptive options today? No      Contraception Wrap Up   Current Method Oral Contraceptive    End Method Oral Contraceptive    Contraception Counseling Provided No    How was the end contraceptive method provided? Prescription            The pregnancy intention screening data noted above was reviewed. Potential methods of contraception were discussed. The patient elected to proceed with Oral Contraceptive.  OB History  Gravida Para Term Preterm AB Living  1 1 1  0 0 1  SAB IAB Ectopic Multiple Live Births  0 0 0 0 1    # Outcome Date GA Lbr Len/2nd Weight Sex Delivery Anes PTL Lv  1 Term 01/23/16 [redacted]w[redacted]d 09:22 / 01:48 7 lb 8.3 oz (3.41 kg) M Vag-Forceps EPI  LIV     Birth  Comments: couple of tiny nevus on right rib-cage area     Name: Nabi,BOY Tarahji     Apgar1: 8  Apgar5: 9    Past Medical History:  Diagnosis Date   Hidradenitis suppurativa    History of trichomoniasis 12/04/2018   Hypertension    Obesity     Past Surgical History:  Procedure Laterality Date   SLEEVE GASTROPLASTY      Family History  Problem Relation Age of Onset   Hypertension Mother    Diabetes Brother    Healthy Father    Breast cancer Neg Hx    Ovarian cancer Neg Hx    Colon cancer Neg Hx     Social History   Socioeconomic History   Marital status: Single    Spouse name: Not on file   Number of children: Not on file   Years of education: Not on file   Highest education level: Not on file  Occupational History   Not on file  Tobacco Use   Smoking status: Never   Smokeless tobacco: Never  Vaping Use   Vaping Use: Never used  Substance and Sexual Activity   Alcohol use: Yes    Comment: occasional   Drug use: No   Sexual activity: Yes    Birth control/protection: Pill  Other Topics Concern   Not on file  Social History Narrative  Not on file   Social Determinants of Health   Financial Resource Strain: Not on file  Food Insecurity: Not on file  Transportation Needs: Not on file  Physical Activity: Not on file  Stress: Not on file  Social Connections: Not on file  Intimate Partner Violence: Not on file    Current Outpatient Medications on File Prior to Visit  Medication Sig Dispense Refill   CALCIUM PO Take by mouth.     clindamycin (CLEOCIN-T) 1 % lotion Apply topically daily. To affected areas under arms 60 mL 4   meloxicam (MOBIC) 15 MG tablet Take 1 tablet (15 mg total) by mouth daily. 30 tablet 0   minocycline (MINOCIN) 100 MG capsule Take 1 capsule twice daily with food 60 capsule 3   Multiple Vitamin (MULTI-VITAMIN DAILY PO) Take by mouth.     norgestimate-ethinyl estradiol (ORTHO-CYCLEN) 0.25-35 MG-MCG tablet Take 1 tablet by mouth  daily. 84 tablet 3   No current facility-administered medications on file prior to visit.    No Known Allergies   Review of Systems Constitutional: negative for chills, fatigue, fevers and sweats Eyes: negative for irritation, redness and visual disturbance Ears, nose, mouth, throat, and face: negative for hearing loss, nasal congestion, snoring and tinnitus Respiratory: negative for asthma, cough, sputum Cardiovascular: negative for chest pain, dyspnea, exertional chest pressure/discomfort, irregular heart beat, palpitations and syncope Gastrointestinal: negative for abdominal pain, change in bowel habits, nausea and vomiting Genitourinary: negative for abnormal menstrual periods, genital lesions, sexual problems and , dysuria and urinary incontinence. Positive for vaginal discharge, itching.  Integument/breast: negative for breast lump, breast tenderness and nipple discharge Hematologic/lymphatic: negative for bleeding and easy bruising Musculoskeletal:negative for back pain and muscle weakness. Positive for right wrist pain, wearing brace (sees Ortho).  Neurological: negative for dizziness, headaches, vertigo and weakness Endocrine: negative for diabetic symptoms including polydipsia, polyuria and skin dryness Allergic/Immunologic: negative for hay fever and urticaria      Objective:  Blood pressure 119/70, pulse 64, resp. rate 16, height 5' 9.5" (1.765 m), weight 259 lb 11.2 oz (117.8 kg), last menstrual period 07/20/2022.  Body mass index is 37.8 kg/m.    General Appearance:    Alert, cooperative, no distress, appears stated age, obesity (moderate)  Head:    Normocephalic, without obvious abnormality, atraumatic  Eyes:    PERRL, conjunctiva/corneas clear, EOM's intact, both eyes  Ears:    Normal external ear canals, both ears  Nose:   Nares normal, septum midline, mucosa normal, no drainage or sinus tenderness  Throat:   Lips, mucosa, and tongue normal; teeth and gums normal   Neck:   Supple, symmetrical, trachea midline, no adenopathy; thyroid: no enlargement/tenderness/nodules; no carotid bruit or JVD  Back:     Symmetric, no curvature, ROM normal, no CVA tenderness  Lungs:     Clear to auscultation bilaterally, respirations unlabored  Chest Wall:    No tenderness or deformity   Heart:    Regular rate and rhythm, S1 and S2 normal, no murmur, rub or gallop  Breast Exam:    No tenderness, masses, or nipple abnormality  Abdomen:     Soft, non-tender, bowel sounds active all four quadrants, no masses, no organomegaly.    Genitalia:    Pelvic:external genitalia normal, vagina without lesions, or tenderness, white thick clumpy discharge present, suspicious for yeast.  Rectovaginal septum  normal. Cervix normal in appearance, no cervical motion tenderness, no adnexal masses or tenderness.  Uterus normal size, shape, mobile, regular contours, nontender.  Rectal:    Normal external sphincter.  No hemorrhoids appreciated. Internal exam not done.   Extremities:   Extremities normal, atraumatic, no cyanosis or edema  Pulses:   2+ and symmetric all extremities  Skin:   Skin color, texture, turgor normal, no rashes or lesions  Lymph nodes:   Cervical, supraclavicular, and axillary nodes normal  Neurologic:   CNII-XII intact, normal strength, sensation and reflexes throughout   .  Labs:  Lab Results  Component Value Date   WBC 4.5 08/18/2021   HGB 12.4 08/18/2021   HCT 35.7 08/18/2021   MCV 89 08/18/2021   PLT 334 08/18/2021    Lab Results  Component Value Date   CREATININE 0.79 08/18/2021   BUN 9 08/18/2021   NA 139 08/18/2021   K 4.7 08/18/2021   CL 101 08/18/2021   CO2 23 08/18/2021    Lab Results  Component Value Date   ALT 41 (H) 08/18/2021   AST 23 08/18/2021   ALKPHOS 71 08/18/2021   BILITOT 0.4 08/18/2021    Lab Results  Component Value Date   TSH 1.390 08/18/2021    Lab Results  Component Value Date   HGBA1C 5.5 08/18/2021     Assessment:   1. Encounter for well woman exam with routine gynecological exam   2. Yeast vaginitis   3. Need for immunization against influenza   4. Screening for diabetes mellitus   5. Screening for lipid disorders   6. Obesity (BMI 35.0-39.9 without comorbidity)      Plan:  Blood tests: CBC with diff, Comprehensive metabolic panel, Lipoproteins, and HgbA1c. Breast self exam technique reviewed and patient encouraged to perform self-exam monthly. Contraception: OCP (estrogen/progesterone). Refill given.  Discussed healthy lifestyle modifications. Mammogram  : Not age appropriate Pap smear  UTD . COVID vaccination status: due for this season's vaccination.  Flu Vaccine: Administered today.  Yeast vaginitis, recently treated with Terazole cream last month, will treat with Diflucan. Unsure if this is recurrence vs inadequate initial treatment. Reiterated hygiene care.  Follow up in 1 year for annual exam   Hildred Laser, MD [facility]

## 2022-08-23 ENCOUNTER — Ambulatory Visit (INDEPENDENT_AMBULATORY_CARE_PROVIDER_SITE_OTHER): Payer: BLUE CROSS/BLUE SHIELD | Admitting: Obstetrics and Gynecology

## 2022-08-23 ENCOUNTER — Encounter: Payer: Self-pay | Admitting: Obstetrics and Gynecology

## 2022-08-23 VITALS — BP 119/70 | HR 64 | Resp 16 | Ht 69.5 in | Wt 259.7 lb

## 2022-08-23 DIAGNOSIS — Z13220 Encounter for screening for lipoid disorders: Secondary | ICD-10-CM

## 2022-08-23 DIAGNOSIS — B3731 Acute candidiasis of vulva and vagina: Secondary | ICD-10-CM | POA: Diagnosis not present

## 2022-08-23 DIAGNOSIS — Z131 Encounter for screening for diabetes mellitus: Secondary | ICD-10-CM

## 2022-08-23 DIAGNOSIS — Z01419 Encounter for gynecological examination (general) (routine) without abnormal findings: Secondary | ICD-10-CM

## 2022-08-23 DIAGNOSIS — E669 Obesity, unspecified: Secondary | ICD-10-CM

## 2022-08-23 DIAGNOSIS — Z01411 Encounter for gynecological examination (general) (routine) with abnormal findings: Secondary | ICD-10-CM

## 2022-08-23 DIAGNOSIS — Z23 Encounter for immunization: Secondary | ICD-10-CM | POA: Diagnosis not present

## 2022-08-23 MED ORDER — NORGESTIMATE-ETH ESTRADIOL 0.25-35 MG-MCG PO TABS: 1.0000 | ORAL_TABLET | Freq: Every day | ORAL | 3 refills | Status: DC

## 2022-08-23 MED ORDER — FLUCONAZOLE 150 MG PO TABS: 150.0000 mg | ORAL_TABLET | Freq: Once | ORAL | 3 refills | Status: AC

## 2022-08-24 LAB — CBC
Hematocrit: 33.8 % — ABNORMAL LOW (ref 34.0–46.6)
Hemoglobin: 11.6 g/dL (ref 11.1–15.9)
MCH: 31.7 pg (ref 26.6–33.0)
MCHC: 34.3 g/dL (ref 31.5–35.7)
MCV: 92 fL (ref 79–97)
Platelets: 298 10*3/uL (ref 150–450)
RBC: 3.66 x10E6/uL — ABNORMAL LOW (ref 3.77–5.28)
RDW: 12.8 % (ref 11.7–15.4)
WBC: 4.7 10*3/uL (ref 3.4–10.8)

## 2022-08-24 LAB — LIPID PANEL
Chol/HDL Ratio: 3.5 ratio (ref 0.0–4.4)
Cholesterol, Total: 191 mg/dL (ref 100–199)
HDL: 55 mg/dL (ref >39)
LDL Chol Calc (NIH): 120 mg/dL — ABNORMAL HIGH (ref 0–99)
Triglycerides: 90 mg/dL (ref 0–149)
VLDL Cholesterol Cal: 16 mg/dL (ref 5–40)

## 2022-08-24 LAB — COMPREHENSIVE METABOLIC PANEL
ALT: 21 IU/L (ref 0–32)
AST: 18 IU/L (ref 0–40)
Albumin/Globulin Ratio: 1.3 (ref 1.2–2.2)
Albumin: 4 g/dL (ref 4.0–5.0)
Alkaline Phosphatase: 59 IU/L (ref 44–121)
BUN/Creatinine Ratio: 10 (ref 9–23)
BUN: 8 mg/dL (ref 6–20)
Bilirubin Total: 0.4 mg/dL (ref 0.0–1.2)
CO2: 21 mmol/L (ref 20–29)
Calcium: 9.2 mg/dL (ref 8.7–10.2)
Chloride: 105 mmol/L (ref 96–106)
Creatinine, Ser: 0.78 mg/dL (ref 0.57–1.00)
Globulin, Total: 3.1 g/dL (ref 1.5–4.5)
Glucose: 88 mg/dL (ref 70–99)
Potassium: 4.4 mmol/L (ref 3.5–5.2)
Sodium: 140 mmol/L (ref 134–144)
Total Protein: 7.1 g/dL (ref 6.0–8.5)
eGFR: 105 mL/min/{1.73_m2} (ref >59)

## 2022-08-24 LAB — HEMOGLOBIN A1C
Est. average glucose Bld gHb Est-mCnc: 108 mg/dL
Hgb A1c MFr Bld: 5.4 % (ref 4.8–5.6)

## 2022-11-28 ENCOUNTER — Ambulatory Visit: Payer: BLUE CROSS/BLUE SHIELD | Admitting: Dermatology

## 2022-12-07 ENCOUNTER — Telehealth: Payer: Self-pay

## 2022-12-07 NOTE — Telephone Encounter (Signed)
I spoke with patient regarding her referral to [facility], and she never heard from them to schedule and appointment, but she is also not interested in going through with the referral at this time.

## 2022-12-28 ENCOUNTER — Ambulatory Visit: Payer: Medicaid Other | Admitting: Dermatology

## 2023-03-30 ENCOUNTER — Encounter: Payer: Self-pay | Admitting: Family Medicine

## 2023-03-30 ENCOUNTER — Other Ambulatory Visit: Payer: Self-pay | Admitting: Family Medicine

## 2023-03-30 DIAGNOSIS — Z9884 Bariatric surgery status: Secondary | ICD-10-CM

## 2023-03-30 DIAGNOSIS — K219 Gastro-esophageal reflux disease without esophagitis: Secondary | ICD-10-CM

## 2023-03-30 DIAGNOSIS — R1011 Right upper quadrant pain: Secondary | ICD-10-CM

## 2023-04-04 ENCOUNTER — Ambulatory Visit
Admission: RE | Admit: 2023-04-04 | Discharge: 2023-04-04 | Disposition: A | Discharge status: Home / Self Care | Payer: BC Managed Care – PPO | Source: Ambulatory Visit | Attending: Family Medicine | Admitting: Family Medicine

## 2023-04-04 DIAGNOSIS — Z9884 Bariatric surgery status: Secondary | ICD-10-CM | POA: Diagnosis not present

## 2023-04-04 DIAGNOSIS — K219 Gastro-esophageal reflux disease without esophagitis: Secondary | ICD-10-CM | POA: Diagnosis present

## 2023-04-04 DIAGNOSIS — R1011 Right upper quadrant pain: Secondary | ICD-10-CM | POA: Insufficient documentation

## 2024-01-10 ENCOUNTER — Telehealth: Payer: Self-pay

## 2024-01-10 DIAGNOSIS — Z3041 Encounter for surveillance of contraceptive pills: Secondary | ICD-10-CM

## 2024-01-10 MED ORDER — NORGESTIMATE-ETH ESTRADIOL 0.25-35 MG-MCG PO TABS
1.0000 | ORAL_TABLET | Freq: Every day | ORAL | 0 refills | Status: DC
Start: 2024-01-10 — End: 2024-01-17

## 2024-01-10 NOTE — Telephone Encounter (Signed)
 Patient states she has scheduled her annual for 01/17/24. Requesting refills of norgestimate -ethinyl estradiol  (ORTHO-CYCLEN) 0.25-35 MG-MCG tablet . Advised refill sent. She has been out for one week. Her cycle started last week and just ended. Advised to use back up protection this month.

## 2024-01-17 ENCOUNTER — Other Ambulatory Visit (HOSPITAL_COMMUNITY)
Admission: RE | Admit: 2024-01-17 | Discharge: 2024-01-17 | Disposition: A | Discharge status: Home / Self Care | Source: Ambulatory Visit | Attending: Obstetrics | Admitting: Obstetrics

## 2024-01-17 ENCOUNTER — Encounter: Payer: Self-pay | Admitting: Obstetrics

## 2024-01-17 ENCOUNTER — Ambulatory Visit (INDEPENDENT_AMBULATORY_CARE_PROVIDER_SITE_OTHER): Admitting: Obstetrics

## 2024-01-17 VITALS — BP 112/67 | HR 76 | Ht 69.0 in | Wt 252.0 lb

## 2024-01-17 DIAGNOSIS — Z01419 Encounter for gynecological examination (general) (routine) without abnormal findings: Secondary | ICD-10-CM

## 2024-01-17 DIAGNOSIS — Z124 Encounter for screening for malignant neoplasm of cervix: Secondary | ICD-10-CM | POA: Insufficient documentation

## 2024-01-17 DIAGNOSIS — Z0000 Encounter for general adult medical examination without abnormal findings: Secondary | ICD-10-CM | POA: Insufficient documentation

## 2024-01-17 DIAGNOSIS — Z3041 Encounter for surveillance of contraceptive pills: Secondary | ICD-10-CM

## 2024-01-17 DIAGNOSIS — Z113 Encounter for screening for infections with a predominantly sexual mode of transmission: Secondary | ICD-10-CM | POA: Insufficient documentation

## 2024-01-17 MED ORDER — NORGESTIMATE-ETH ESTRADIOL 0.25-35 MG-MCG PO TABS: 1.0000 | ORAL_TABLET | Freq: Every day | ORAL | 3 refills | Status: AC

## 2024-01-17 NOTE — Progress Notes (Signed)
 ANNUAL GYNECOLOGICAL EXAM  SUBJECTIVE  HPI  Victoria Gibbs is a 33 y.o.-year-old [id number] who presents for an annual gynecological exam today.  She denies pelvic pain, dyspareunia, abnormal vaginal bleeding or discharge, and UTI symptoms. She is generally happy with her birth control but is considering switching to Nexplanon. She is back together with a former partner and has been experiencing a little bit of vaginal itching.   Medical/Surgical History Past Medical History:  Diagnosis Date   Hidradenitis suppurativa    History of trichomoniasis 12/04/2018   Hypertension    Obesity    Past Surgical History:  Procedure Laterality Date   SLEEVE GASTROPLASTY      Social History Lives with son and sister. Feels safe there Work: [employer] facility Exercise: physical activity at work (lifting, walking) Substances: Occasional EtOH. Denies tobacco, vape, and recreational drugs  Obstetric History OB History     Gravida  1   Para  1   Term  1   Preterm      AB      Living  1      SAB      IAB      Ectopic      Multiple  0   Live Births  1            GYN/Menstrual History No LMP recorded. (Menstrual status: Oral contraceptives). Periods ~q3 months Last Pap: 08/18/2021. NILM. Contraception: COCs  Prevention Endorses regular dental and eye exams Mammogram: at 41 Colonoscopy: at 63  Current Medications Outpatient Medications Prior to Visit  Medication Sig   Multiple Vitamin (MULTI-VITAMIN DAILY PO) Take by mouth.   norgestimate -ethinyl estradiol  (ORTHO-CYCLEN) 0.25-35 MG-MCG tablet Take 1 tablet by mouth daily.   CALCIUM  PO Take by mouth. (Patient not taking: Reported on 01/17/2024)   meloxicam  (MOBIC ) 15 MG tablet Take 1 tablet (15 mg total) by mouth daily. (Patient not taking: Reported on 01/17/2024)   minocycline  (MINOCIN ) 100 MG capsule Take 1 capsule twice daily with food (Patient not taking: Reported on 01/17/2024)   No  facility-administered medications prior to visit.        ROS Constitutional: Denied constitutional symptoms, night sweats, recent illness, fatigue, fever, insomnia and weight loss.  Eyes: Denied eye symptoms, eye pain, photophobia, vision change and visual disturbance.  Ears/Nose/Throat/Neck: Denied ear, nose, throat or neck symptoms, hearing loss, nasal discharge, sinus congestion and sore throat.  Cardiovascular: Denied cardiovascular symptoms, arrhythmia, chest pain/pressure, edema, exercise intolerance, orthopnea and palpitations.  Respiratory: Denied pulmonary symptoms, asthma, pleuritic pain, productive sputum, cough, dyspnea and wheezing.  Gastrointestinal: Denied gastro-esophageal reflux, melena. +nausea - has endoscopy scheduled  Genitourinary: Denied genitourinary symptoms including symptomatic vaginal discharge, pelvic relaxation issues, and urinary complaints.  Musculoskeletal: Denied musculoskeletal symptoms, stiffness, swelling, muscle weakness and myalgia.  Dermatologic: Denied dermatology symptoms, rash and scar.  Neurologic: Denied neurology symptoms, dizziness, headache, neck pain and syncope.  Psychiatric: Denied psychiatric symptoms, anxiety and depression.  Endocrine: Denied endocrine symptoms including hot flashes. +night sweats    OBJECTIVE  Ht 5\' 9"  (1.753 m)   Wt 252 lb (114.3 kg)   BMI 37.21 kg/m    Physical examination General NAD, Conversant  HEENT Atraumatic; Op clear with mmm.  Normo-cephalic. Pupils reactive. Anicteric sclerae  Thyroid /Neck Smooth without nodularity or enlargement. Normal ROM.  Neck Supple.  Skin No rashes, lesions or ulceration. Normal palpated skin turgor. No nodularity.  Breasts: No masses or discharge.  Symmetric.  No axillary adenopathy.  Lungs: Clear  to auscultation.No rales or wheezes. Normal Respiratory effort, no retractions.  Heart: NSR.  No murmurs or rubs appreciated. No peripheral edema  Abdomen: Soft.  Non-tender.  No  masses.  No HSM. No hernia  Extremities: Moves all appropriately.  Normal ROM for age. No lymphadenopathy.  Neuro: Oriented to PPT.  Normal mood. Normal affect.     Pelvic:   Vulva: Normal appearance.  No lesions.  Vagina: No lesions or abnormalities noted. Moderate amount of thick white discharge.  Support: Normal pelvic support.  Urethra No masses tenderness or scarring.  Meatus Normal size without lesions or prolapse.  Cervix: Normal appearance.  No lesions.  Anus: Normal exam.  No lesions.  Perineum: Normal exam.  No lesions.    ASSESSMENT  1) Annual exam 2) Due for Pap 3) Contraceptive refill  PLAN 1) Physical exam as noted. Discussed healthy lifestyle choices and preventive care. STI testing today. 2) Pap collected. F/u based on results 3) COCs refilled. Schedule appt for Nexplanon placement if desired. Return in one year for annual exam or as needed for concerns.   Estelle Skibicki, CNM

## 2024-01-18 LAB — CYTOLOGY - PAP
Chlamydia: NEGATIVE
Comment: NEGATIVE
Comment: NEGATIVE
Comment: NEGATIVE
Comment: NORMAL
Diagnosis: NEGATIVE
High risk HPV: NEGATIVE
Neisseria Gonorrhea: NEGATIVE
Trichomonas: NEGATIVE

## 2024-01-18 LAB — CERVICOVAGINAL ANCILLARY ONLY
Bacterial Vaginitis (gardnerella): NEGATIVE
Candida Glabrata: POSITIVE — AB
Candida Vaginitis: POSITIVE — AB
Comment: NEGATIVE
Comment: NEGATIVE
Comment: NEGATIVE

## 2024-01-18 LAB — HEP, RPR, HIV PANEL
HIV Screen 4th Generation wRfx: NONREACTIVE
Hepatitis B Surface Ag: NEGATIVE
RPR Ser Ql: NONREACTIVE

## 2024-01-19 ENCOUNTER — Other Ambulatory Visit: Payer: Self-pay | Admitting: Obstetrics

## 2024-01-19 ENCOUNTER — Encounter: Payer: Self-pay | Admitting: Obstetrics

## 2024-01-19 MED ORDER — FLUCONAZOLE 150 MG PO TABS
150.0000 mg | ORAL_TABLET | Freq: Once | ORAL | 1 refills | Status: AC
Start: 2024-01-19 — End: 2024-01-19

## 2024-01-19 MED ORDER — METRONIDAZOLE 500 MG PO TABS: 500.0000 mg | ORAL_TABLET | Freq: Two times a day (BID) | ORAL | 0 refills | Status: AC

## 2024-01-19 NOTE — Progress Notes (Signed)
+  BV and yeast. Rx for metronidazole  500 mg PO BID x 7 days and Diflucan  150 mg PO sent to pharmacy. Embry notified via MyChart.  M. Aisha Ali, CNM
